# Patient Record
Sex: Female | Born: 1960 | Race: Black or African American | Hispanic: No | Marital: Single | Smoking: Never smoker
Health system: Southern US, Community
[De-identification: ages and names within clinical notes are randomized; demographics above are authoritative.]

## PROBLEM LIST (undated history)

## (undated) DIAGNOSIS — E079 Disorder of thyroid, unspecified: Secondary | ICD-10-CM

## (undated) DIAGNOSIS — E119 Type 2 diabetes mellitus without complications: Secondary | ICD-10-CM

## (undated) DIAGNOSIS — I10 Essential (primary) hypertension: Secondary | ICD-10-CM

## (undated) DIAGNOSIS — M1990 Unspecified osteoarthritis, unspecified site: Secondary | ICD-10-CM

## (undated) HISTORY — PX: HERNIA REPAIR: SHX51

## (undated) HISTORY — PX: ABDOMINAL HYSTERECTOMY: SHX81

---

## 1998-08-05 ENCOUNTER — Other Ambulatory Visit: Admission: RE | Admit: 1998-08-05 | Discharge: 1998-08-05 | Payer: Self-pay | Admitting: Obstetrics and Gynecology

## 1999-09-15 ENCOUNTER — Other Ambulatory Visit: Admission: RE | Admit: 1999-09-15 | Discharge: 1999-09-15 | Payer: Self-pay | Admitting: Obstetrics and Gynecology

## 2000-04-25 ENCOUNTER — Ambulatory Visit (HOSPITAL_BASED_OUTPATIENT_CLINIC_OR_DEPARTMENT_OTHER): Admission: RE | Admit: 2000-04-25 | Discharge: 2000-04-25 | Payer: Self-pay | Admitting: General Surgery

## 2001-11-01 ENCOUNTER — Other Ambulatory Visit: Admission: RE | Admit: 2001-11-01 | Discharge: 2001-11-01 | Payer: Self-pay | Admitting: Obstetrics and Gynecology

## 2002-12-05 ENCOUNTER — Encounter: Admission: RE | Admit: 2002-12-05 | Discharge: 2002-12-05 | Payer: Self-pay | Admitting: Family Medicine

## 2002-12-05 ENCOUNTER — Encounter: Payer: Self-pay | Admitting: Family Medicine

## 2003-03-05 ENCOUNTER — Other Ambulatory Visit: Admission: RE | Admit: 2003-03-05 | Discharge: 2003-03-05 | Payer: Self-pay | Admitting: Obstetrics and Gynecology

## 2010-08-20 ENCOUNTER — Inpatient Hospital Stay (HOSPITAL_COMMUNITY): Payer: Self-pay

## 2010-08-20 ENCOUNTER — Inpatient Hospital Stay (HOSPITAL_COMMUNITY)
Admission: AD | Admit: 2010-08-20 | Discharge: 2010-08-22 | DRG: 644 | Disposition: A | Discharge status: Home / Self Care | Payer: Self-pay | Source: Ambulatory Visit | Attending: Internal Medicine | Admitting: Internal Medicine

## 2010-08-20 DIAGNOSIS — G610 Guillain-Barre syndrome: Secondary | ICD-10-CM | POA: Diagnosis present

## 2010-08-20 DIAGNOSIS — E039 Hypothyroidism, unspecified: Principal | ICD-10-CM | POA: Diagnosis present

## 2010-08-20 DIAGNOSIS — I10 Essential (primary) hypertension: Secondary | ICD-10-CM | POA: Diagnosis present

## 2010-08-20 DIAGNOSIS — M79609 Pain in unspecified limb: Secondary | ICD-10-CM | POA: Diagnosis present

## 2010-08-20 DIAGNOSIS — E876 Hypokalemia: Secondary | ICD-10-CM | POA: Diagnosis present

## 2010-08-20 DIAGNOSIS — R29898 Other symptoms and signs involving the musculoskeletal system: Secondary | ICD-10-CM | POA: Diagnosis present

## 2010-08-20 LAB — CBC
HCT: 35.9 % — ABNORMAL LOW (ref 36.0–46.0)
Hemoglobin: 11.6 g/dL — ABNORMAL LOW (ref 12.0–15.0)
MCHC: 32.3 g/dL (ref 30.0–36.0)
MCV: 83.1 fL (ref 78.0–100.0)
RBC: 4.32 MIL/uL (ref 3.87–5.11)
RDW: 16.1 % — ABNORMAL HIGH (ref 11.5–15.5)
WBC: 7.3 10*3/uL (ref 4.0–10.5)

## 2010-08-20 LAB — COMPREHENSIVE METABOLIC PANEL
ALT: 14 U/L (ref 0–35)
AST: 17 U/L (ref 0–37)
Albumin: 3.9 g/dL (ref 3.5–5.2)
Alkaline Phosphatase: 49 U/L (ref 39–117)
CO2: 24 mEq/L (ref 19–32)
Chloride: 108 mEq/L (ref 96–112)
Creatinine, Ser: 0.83 mg/dL (ref 0.4–1.2)
Glucose, Bld: 100 mg/dL — ABNORMAL HIGH (ref 70–99)
Potassium: 3.1 mEq/L — ABNORMAL LOW (ref 3.5–5.1)
Total Protein: 7.3 g/dL (ref 6.0–8.3)

## 2010-08-20 LAB — URINALYSIS, ROUTINE W REFLEX MICROSCOPIC
Bilirubin Urine: NEGATIVE
Hgb urine dipstick: NEGATIVE
Ketones, ur: NEGATIVE mg/dL
Nitrite: NEGATIVE
Protein, ur: NEGATIVE mg/dL
Specific Gravity, Urine: 1.022 (ref 1.005–1.030)
Urine Glucose, Fasting: NEGATIVE mg/dL
Urobilinogen, UA: 0.2 mg/dL (ref 0.0–1.0)
pH: 6 (ref 5.0–8.0)

## 2010-08-20 LAB — PROTIME-INR
INR: 1.05 (ref 0.00–1.49)
Prothrombin Time: 13.9 seconds (ref 11.6–15.2)

## 2010-08-20 LAB — DIFFERENTIAL
Basophils Absolute: 0 10*3/uL (ref 0.0–0.1)
Eosinophils Absolute: 0.3 10*3/uL (ref 0.0–0.7)
Lymphs Abs: 3.3 10*3/uL (ref 0.7–4.0)
Monocytes Absolute: 0.5 10*3/uL (ref 0.1–1.0)
Neutro Abs: 3.2 10*3/uL (ref 1.7–7.7)

## 2010-08-20 IMAGING — CT CT HEAD W/O CM
1 series · 16 of 30 positions shown, 20 images · non-contrast
Comparison: None.

CLINICAL DATA: Headache, dizziness, blurred vision

CT HEAD WITHOUT CONTRAST
TECHNIQUE: Contiguous axial images were obtained from the base of
the skull through the vertex without contrast.

[Series 2: head_seq 4.5 h37s st · axial · 0.43mm/px · z∈[+524,+650]mm · 16 of 32 slices shown, 20 images]
[im 2/32  brain]
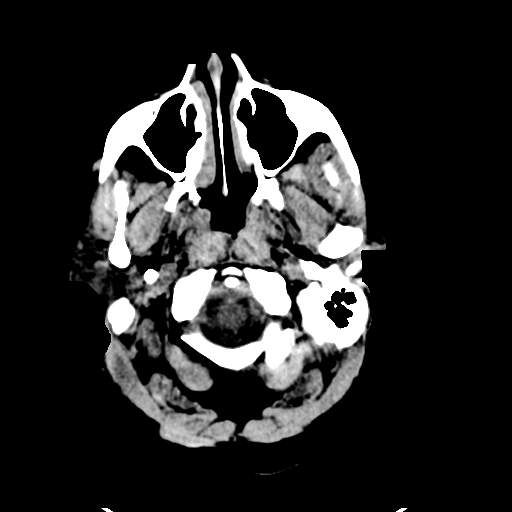
[im 2/32  bone]
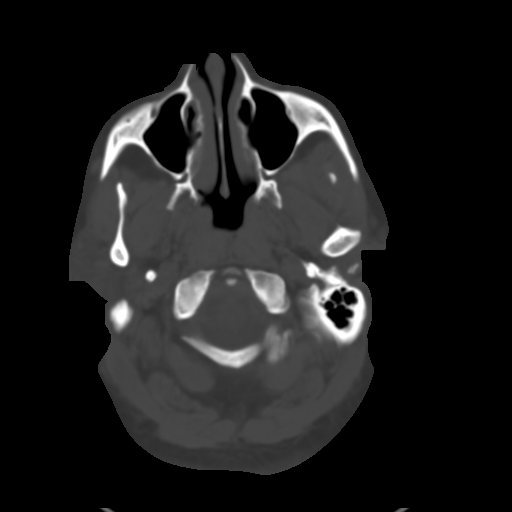
[im 4/32  brain]
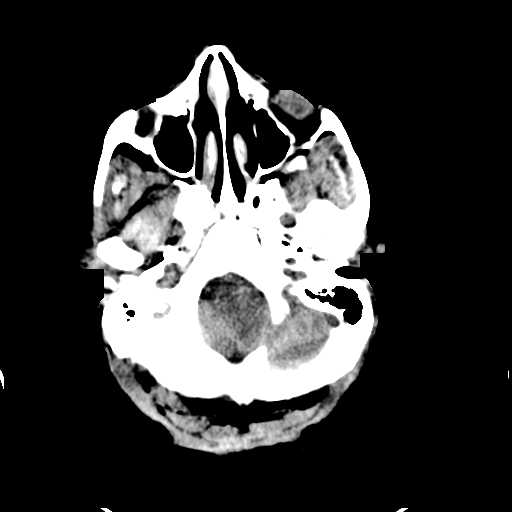
[im 6/32  brain]
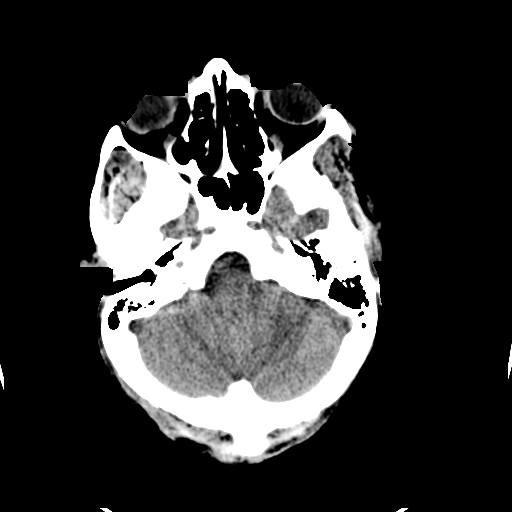
[im 8/32  brain]
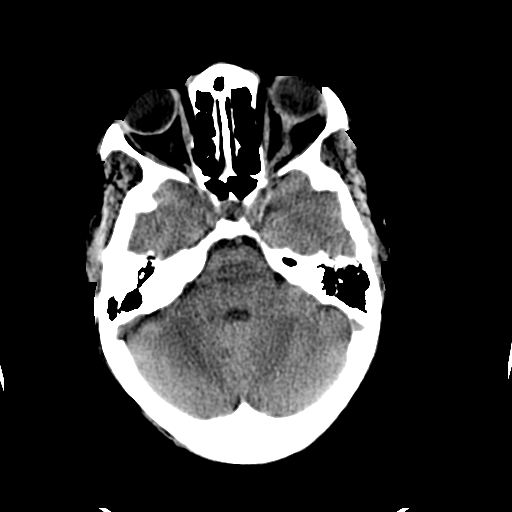
[im 9/32  brain]
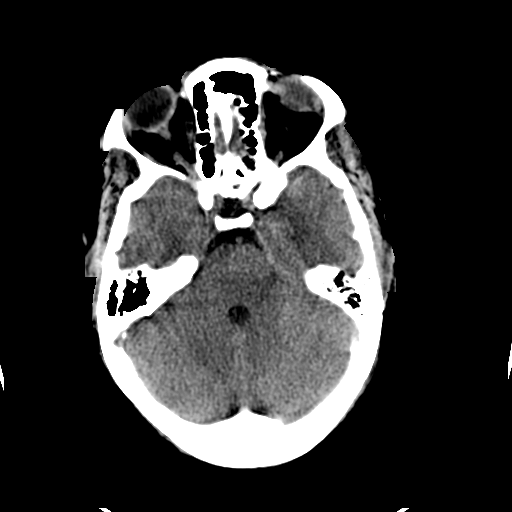
[im 9/32  bone]
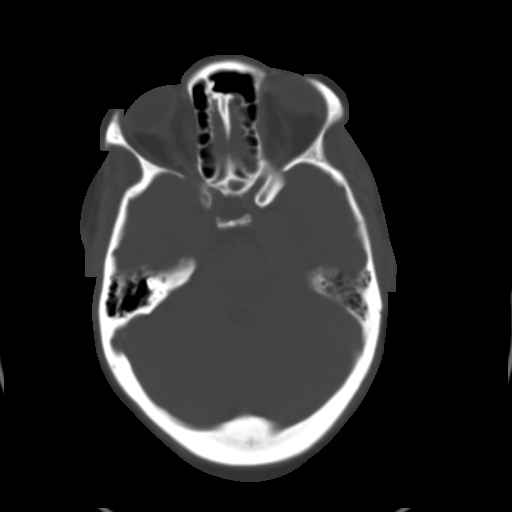
[im 11/32  brain]
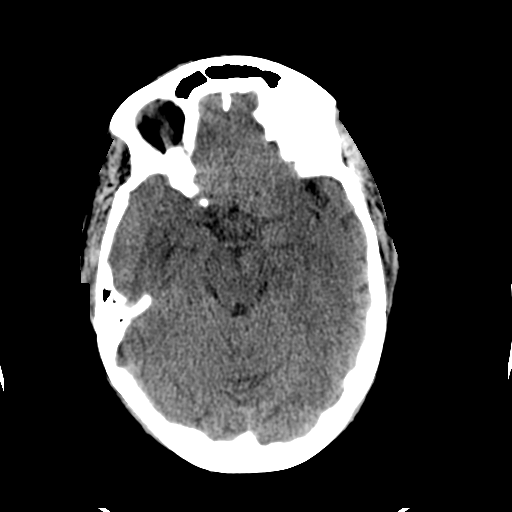
[im 13/32  brain]
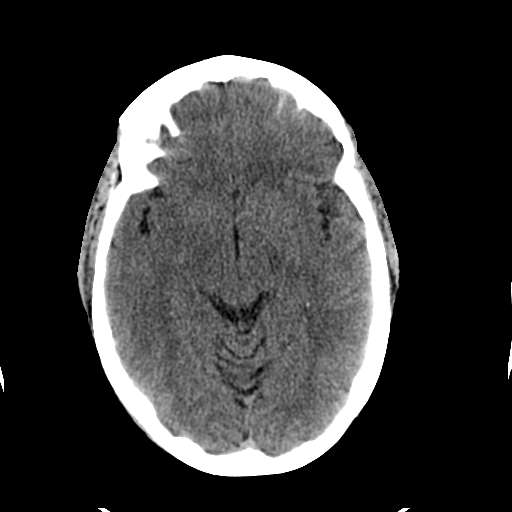
[im 15/32  brain]
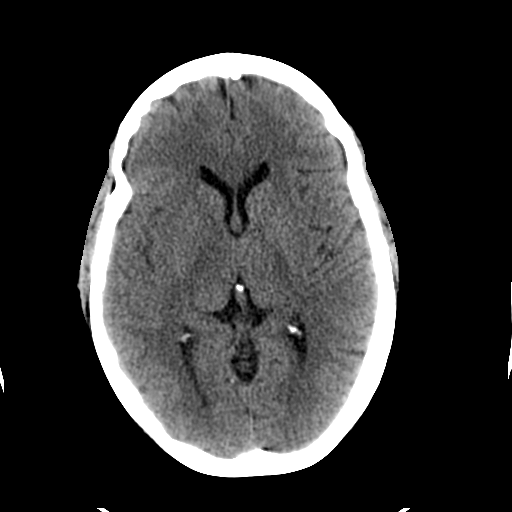
[im 17/32  brain]
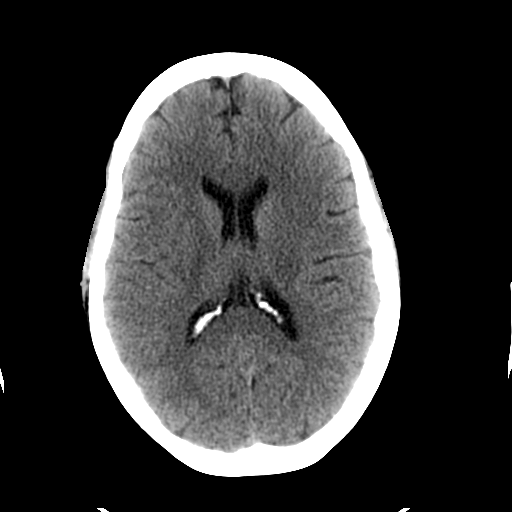
[im 17/32  bone]
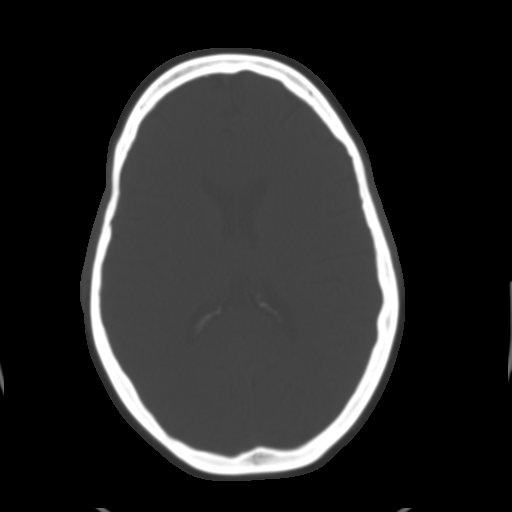
[im 19/32  brain]
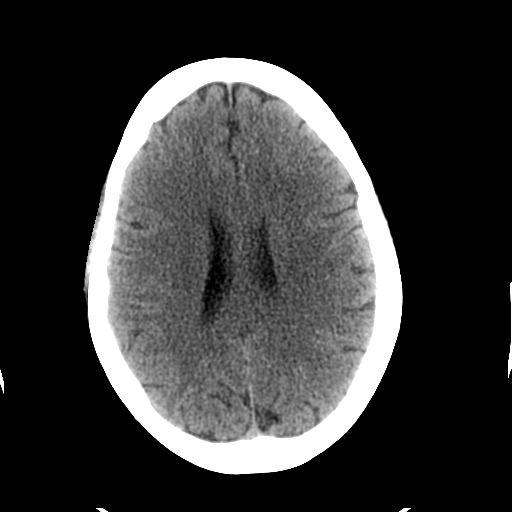
[im 21/32  brain]
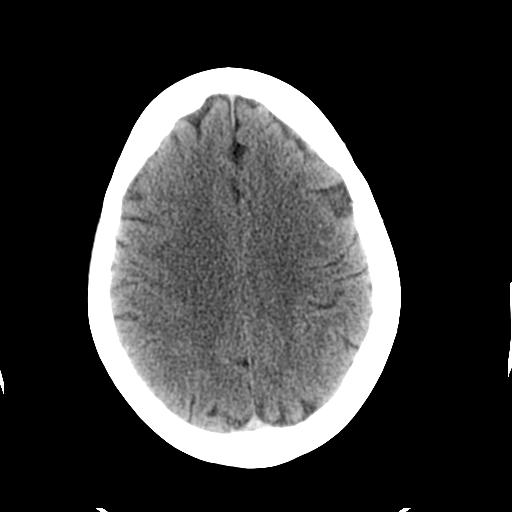
[im 23/32  brain]
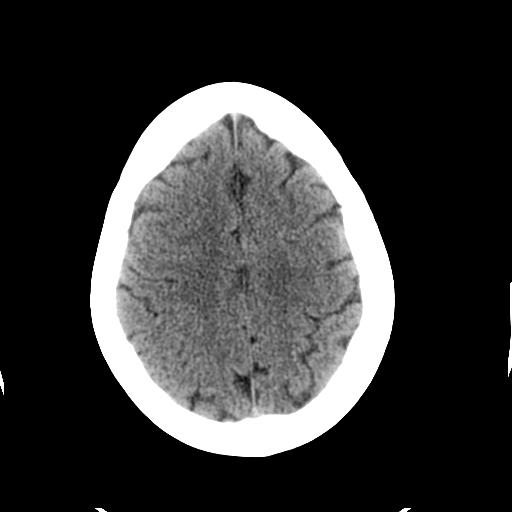
[im 24/32  brain]
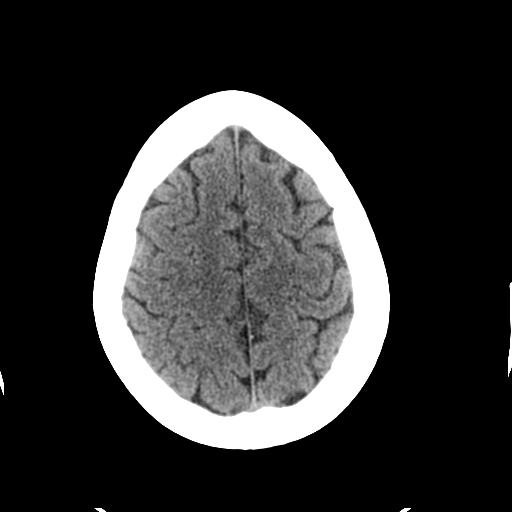
[im 24/32  bone]
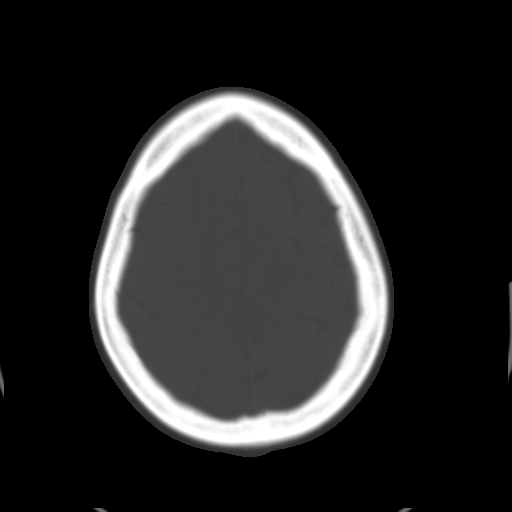
[im 26/32  brain]
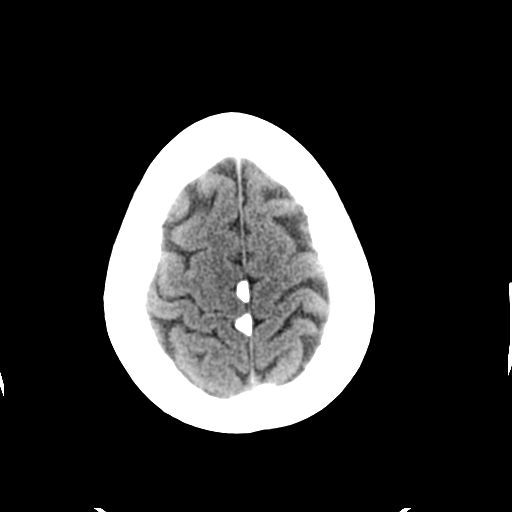
[im 28/32  brain]
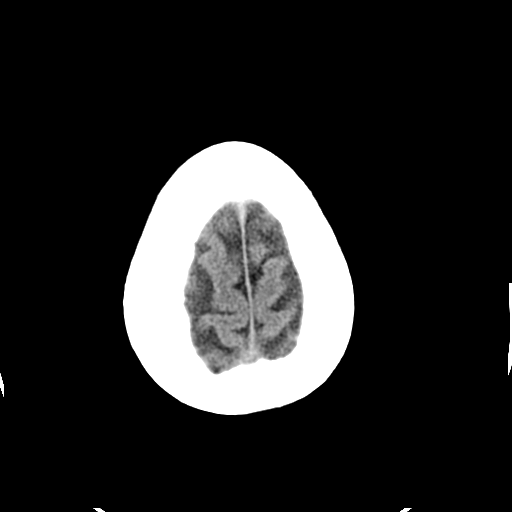
[im 30/32  brain]
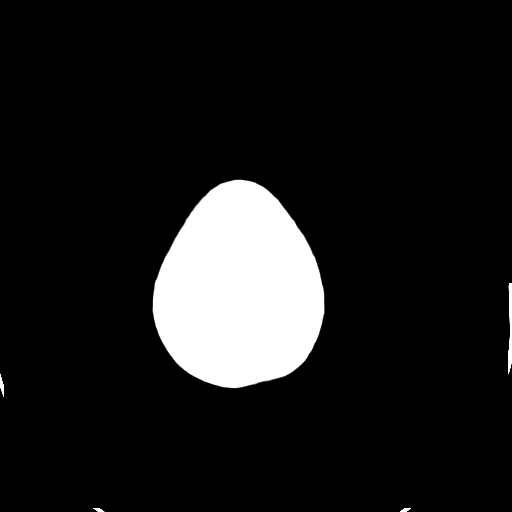

[16 of 30 positions shown; findings below may reference images not displayed]

FINDINGS: There is no evidence of acute intracranial hemorrhage, brain edema,
mass lesion, acute infarction,   mass effect, or midline shift.
Acute infarct may be inapparent on noncontrast CT.  No other intra-
axial abnormalities are seen, and the ventricles and sulci are
within normal limits in size and symmetry.   No abnormal extra-
axial fluid collections or masses are identified.  No significant
calvarial abnormality.
IMPRESSION: 1. Negative for bleed or other acute intracranial process.

## 2010-08-20 IMAGING — CR DG CHEST 2V
2 series · 2 of 2 positions shown · non-contrast
Comparison: None.

CLINICAL DATA: Chest pain

CHEST - 2 VIEW

[w chest pa]
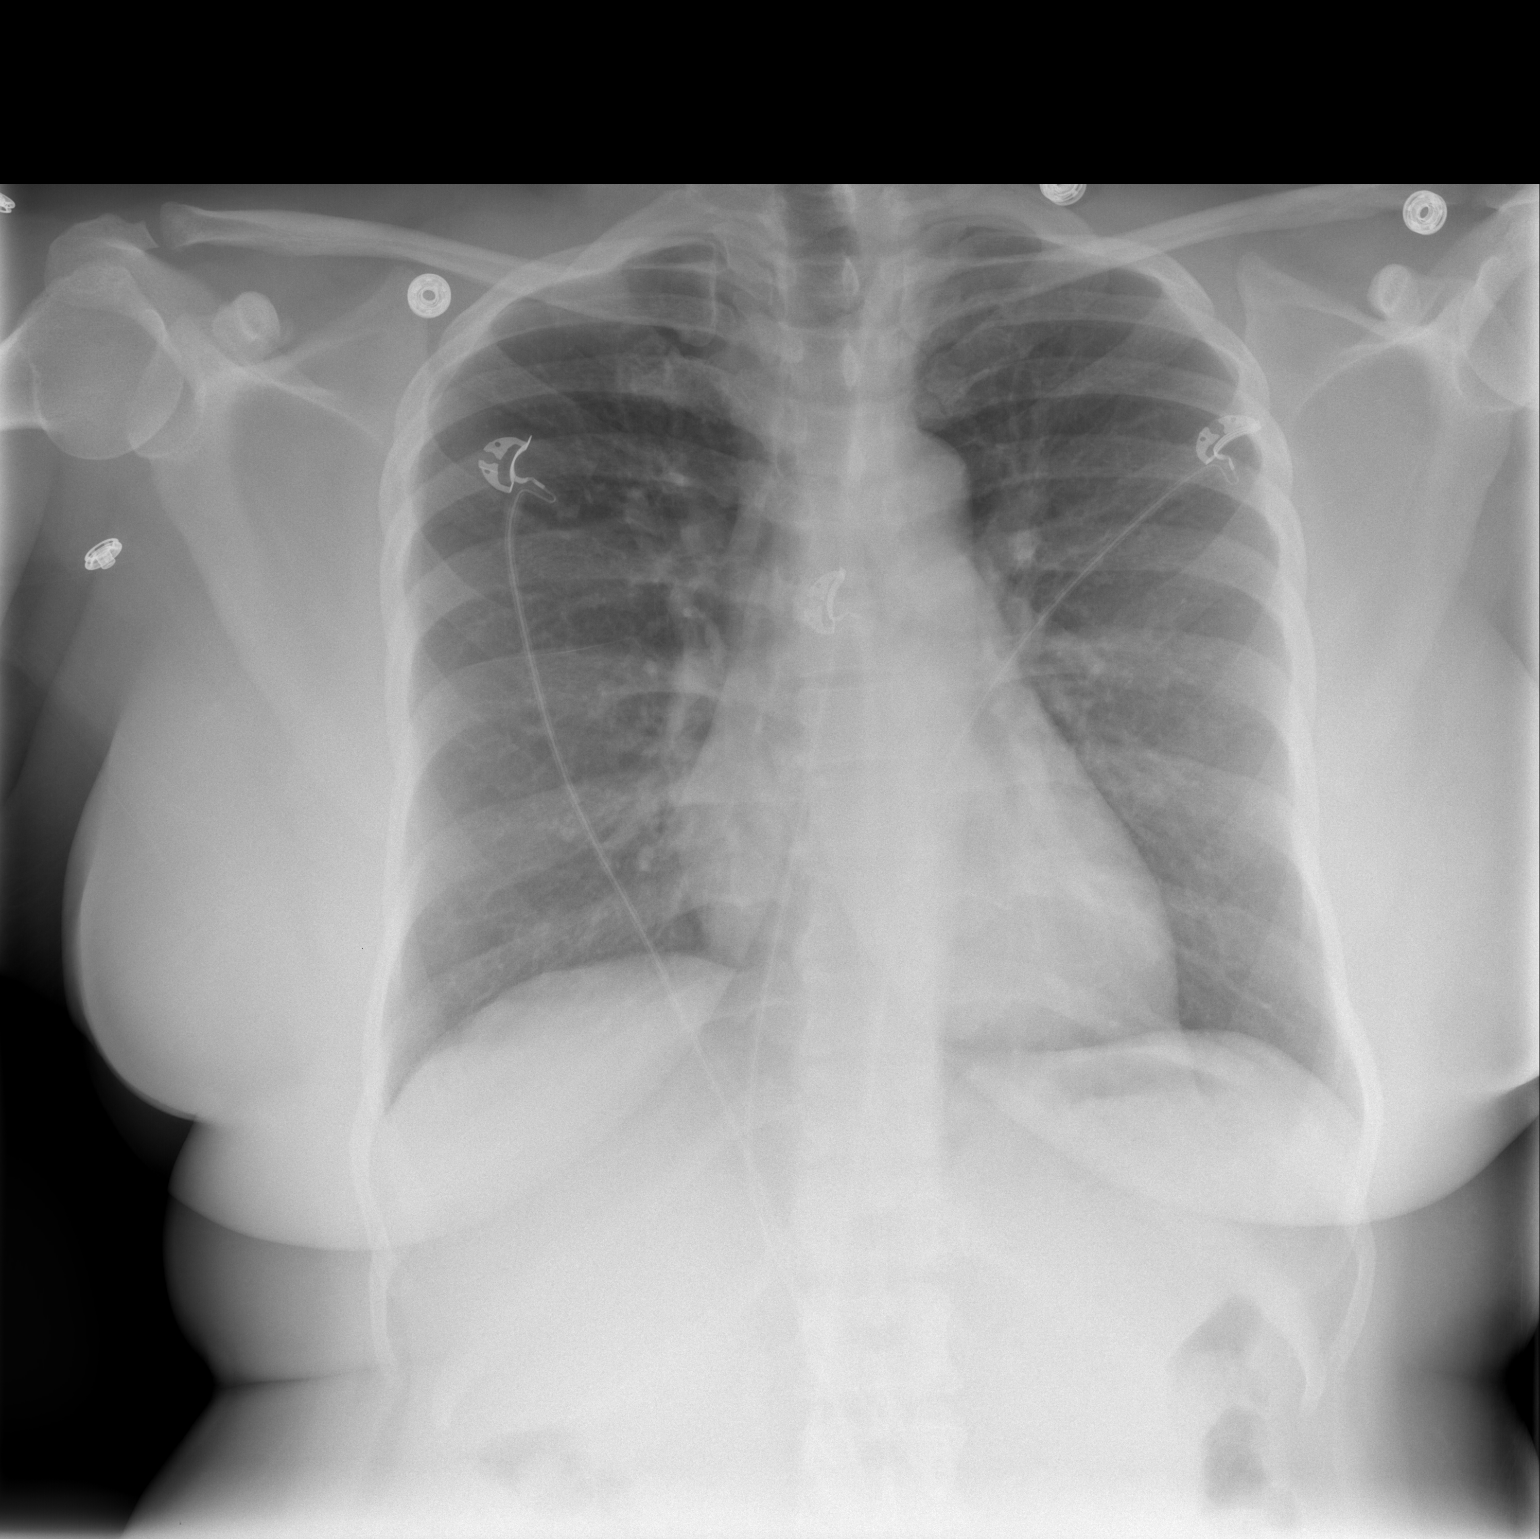

[w chest lat]
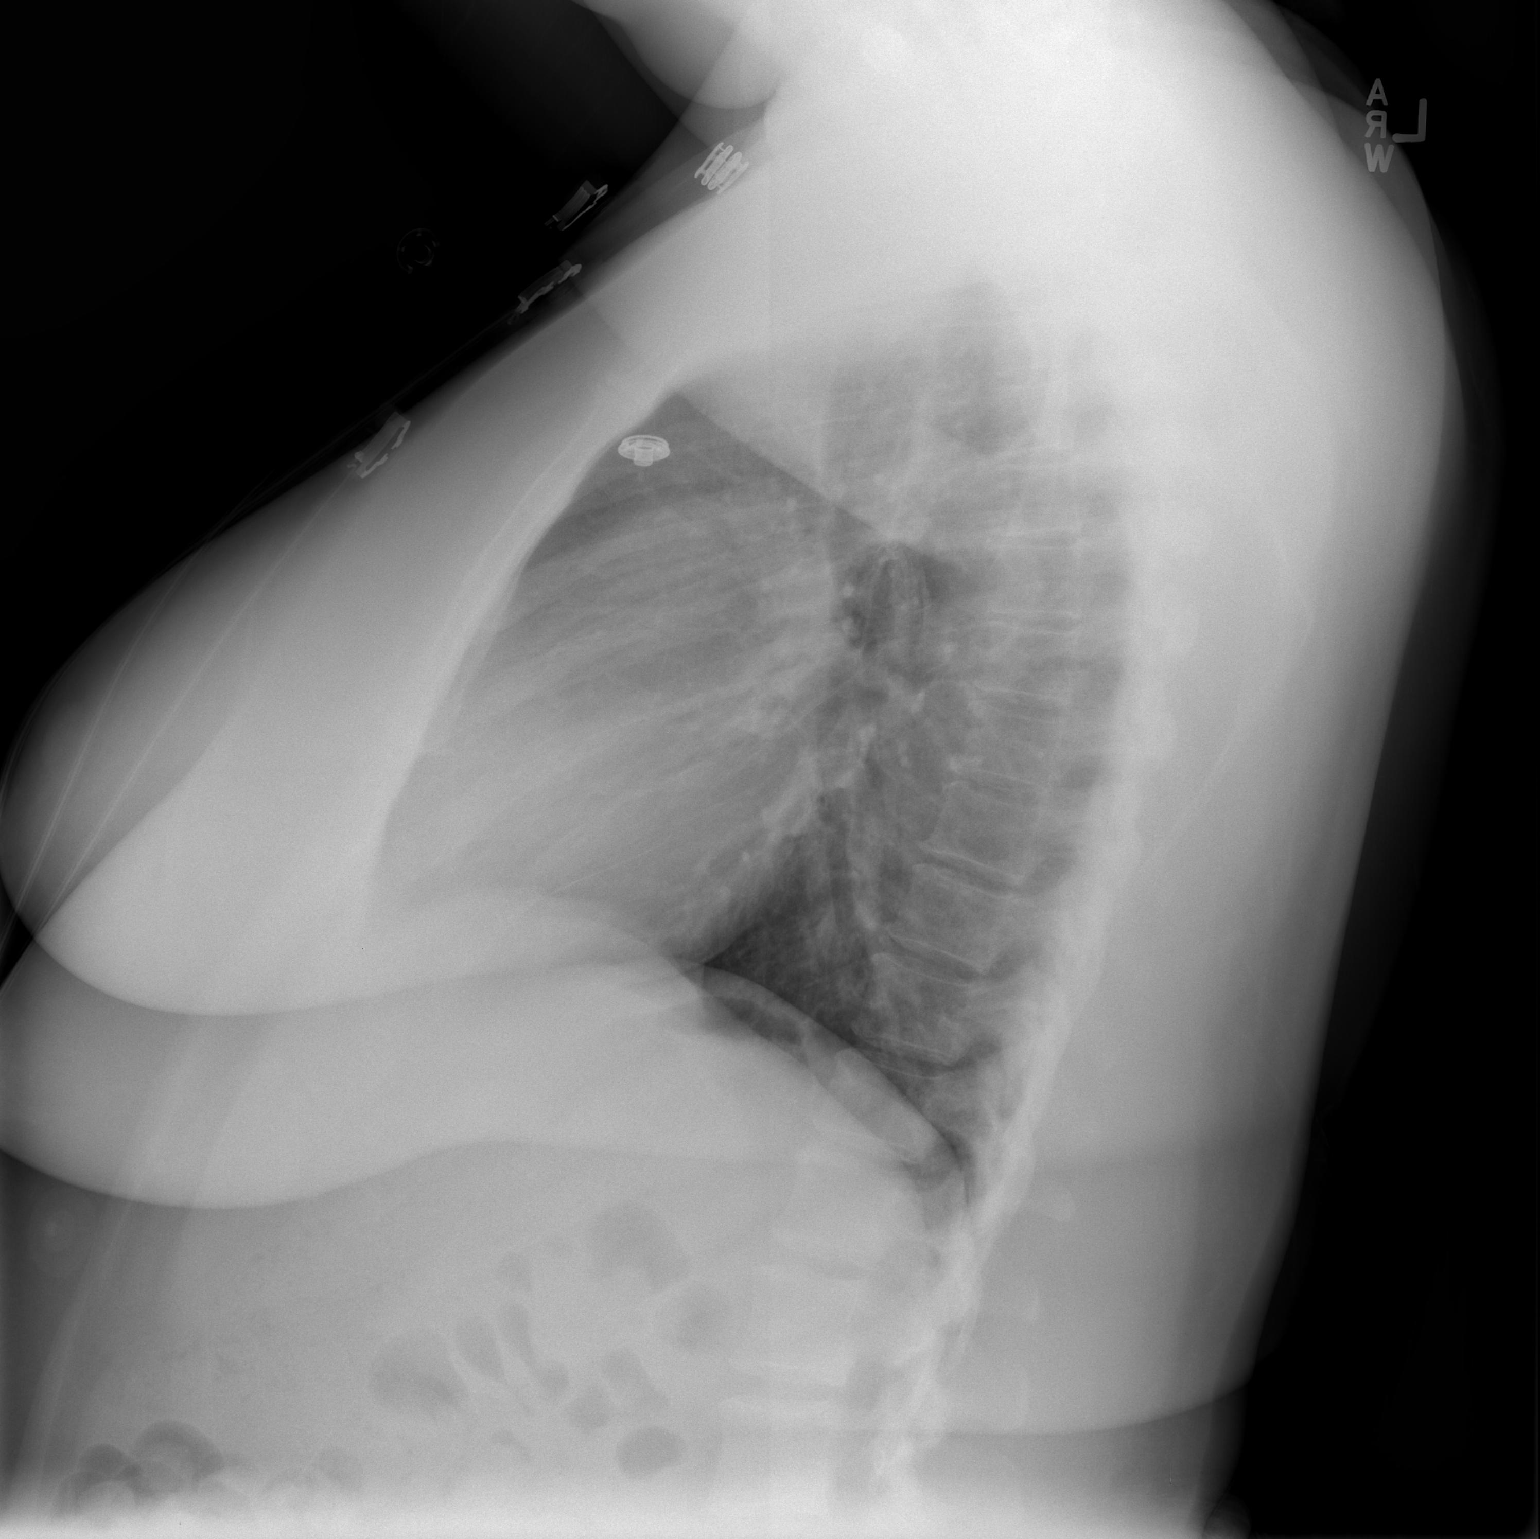

[2 of 2 positions shown; findings below may reference images not displayed]

FINDINGS: Lungs clear.  Heart size and pulmonary vascularity
normal.  No effusion.  Visualized bones unremarkable.
IMPRESSION: No acute disease

## 2010-08-21 ENCOUNTER — Inpatient Hospital Stay (HOSPITAL_COMMUNITY): Payer: Self-pay

## 2010-08-21 DIAGNOSIS — R072 Precordial pain: Secondary | ICD-10-CM

## 2010-08-21 LAB — COMPREHENSIVE METABOLIC PANEL
CO2: 25 mEq/L (ref 19–32)
Calcium: 9 mg/dL (ref 8.4–10.5)
Creatinine, Ser: 0.84 mg/dL (ref 0.4–1.2)
GFR calc non Af Amer: >60 mL/min (ref >60)
Glucose, Bld: 114 mg/dL — ABNORMAL HIGH (ref 70–99)
Total Protein: 6.4 g/dL (ref 6.0–8.3)

## 2010-08-21 LAB — DIFFERENTIAL
Basophils Relative: 0 % (ref 0–1)
Eosinophils Relative: 5 % (ref 0–5)
Lymphocytes Relative: 48 % — ABNORMAL HIGH (ref 12–46)
Monocytes Absolute: 0.5 10*3/uL (ref 0.1–1.0)
Monocytes Relative: 8 % (ref 3–12)
Neutro Abs: 2.4 10*3/uL (ref 1.7–7.7)

## 2010-08-21 LAB — MAGNESIUM: Magnesium: 2.1 mg/dL (ref 1.5–2.5)

## 2010-08-21 LAB — CBC
HCT: 35.3 % — ABNORMAL LOW (ref 36.0–46.0)
Hemoglobin: 11.2 g/dL — ABNORMAL LOW (ref 12.0–15.0)
MCH: 26.8 pg (ref 26.0–34.0)
MCHC: 31.7 g/dL (ref 30.0–36.0)
RDW: 16.4 % — ABNORMAL HIGH (ref 11.5–15.5)

## 2010-08-21 LAB — RPR: RPR Ser Ql: NONREACTIVE

## 2010-08-21 LAB — APTT: aPTT: 32 seconds (ref 24–37)

## 2010-08-21 LAB — LIPID PANEL
Cholesterol: 200 mg/dL (ref 0–200)
LDL Cholesterol: 133 mg/dL — ABNORMAL HIGH (ref 0–99)

## 2010-08-21 LAB — TSH
TSH: 14.007 u[IU]/mL — ABNORMAL HIGH (ref 0.350–4.500)
TSH: 12.471 u[IU]/mL — ABNORMAL HIGH (ref 0.350–4.500)

## 2010-08-21 LAB — SEDIMENTATION RATE: Sed Rate: 9 mm/hr (ref 0–22)

## 2010-08-21 LAB — C-REACTIVE PROTEIN: CRP: 0.4 mg/dL — ABNORMAL LOW (ref <0.6)

## 2010-08-21 LAB — PHOSPHORUS: Phosphorus: 4.2 mg/dL (ref 2.3–4.6)

## 2010-08-21 IMAGING — CR DG LUMBAR SPINE COMPLETE 4+V
5 series · 5 of 5 positions shown · non-contrast
Comparison: None.

CLINICAL DATA: 49-year-old female with pain.  No known injury.

LUMBAR SPINE - COMPLETE 4+ VIEW

[t l-spine a.p.]
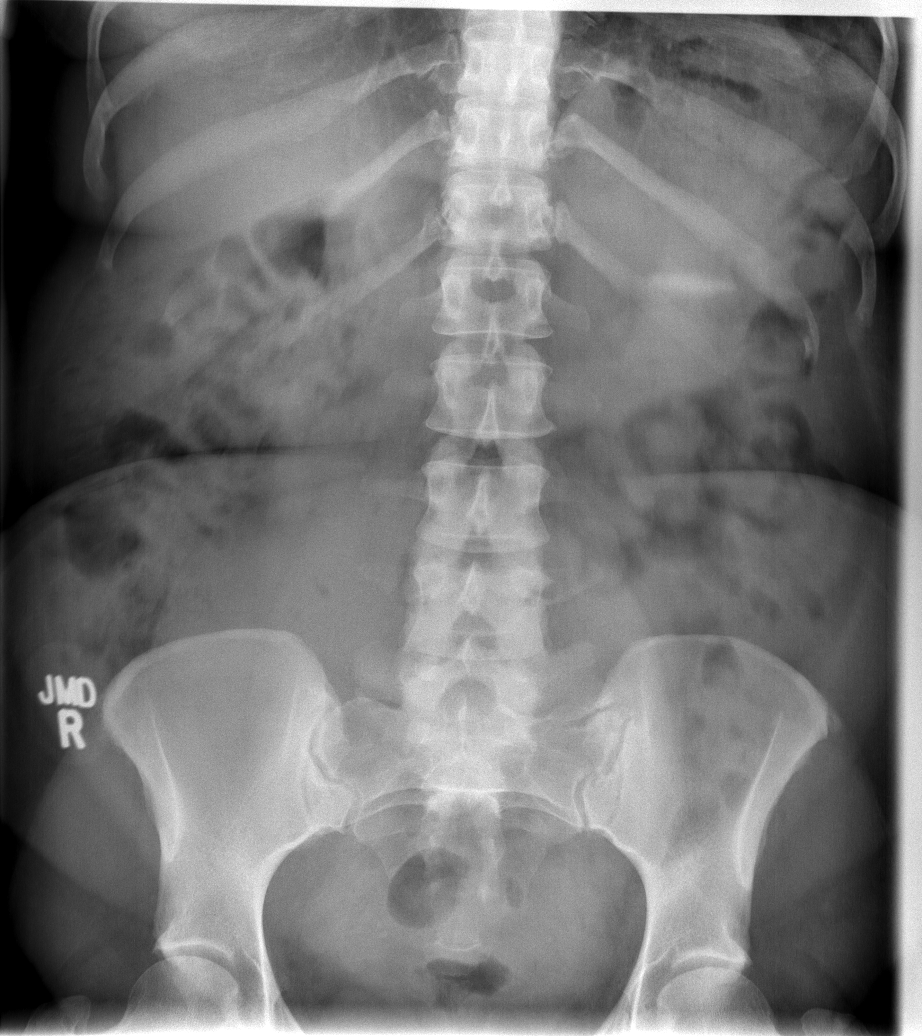

[t l-spine oblique exposure *]
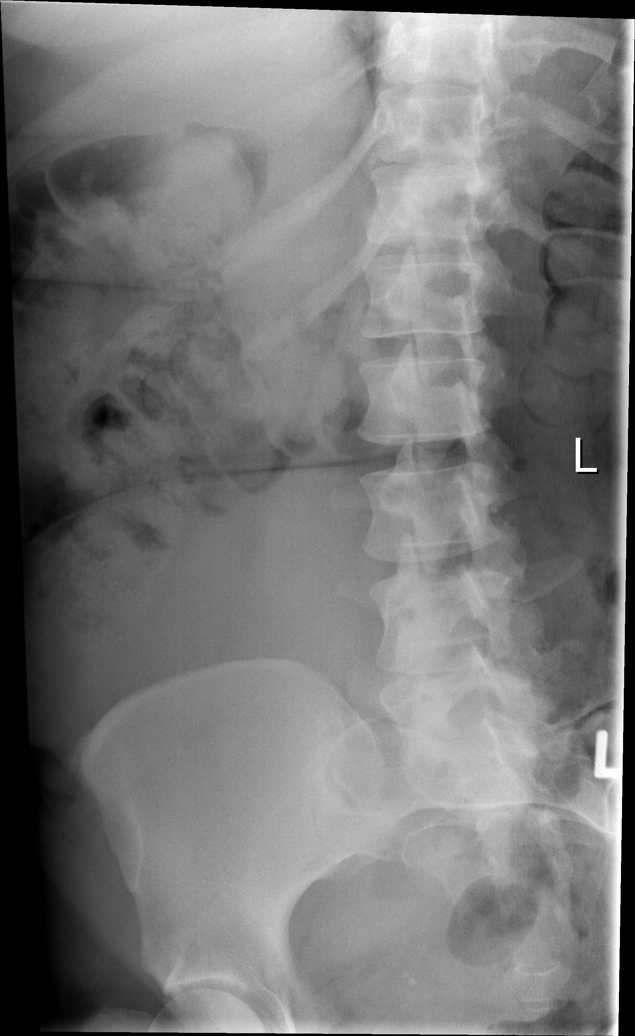

[t l-spine oblique exposure]
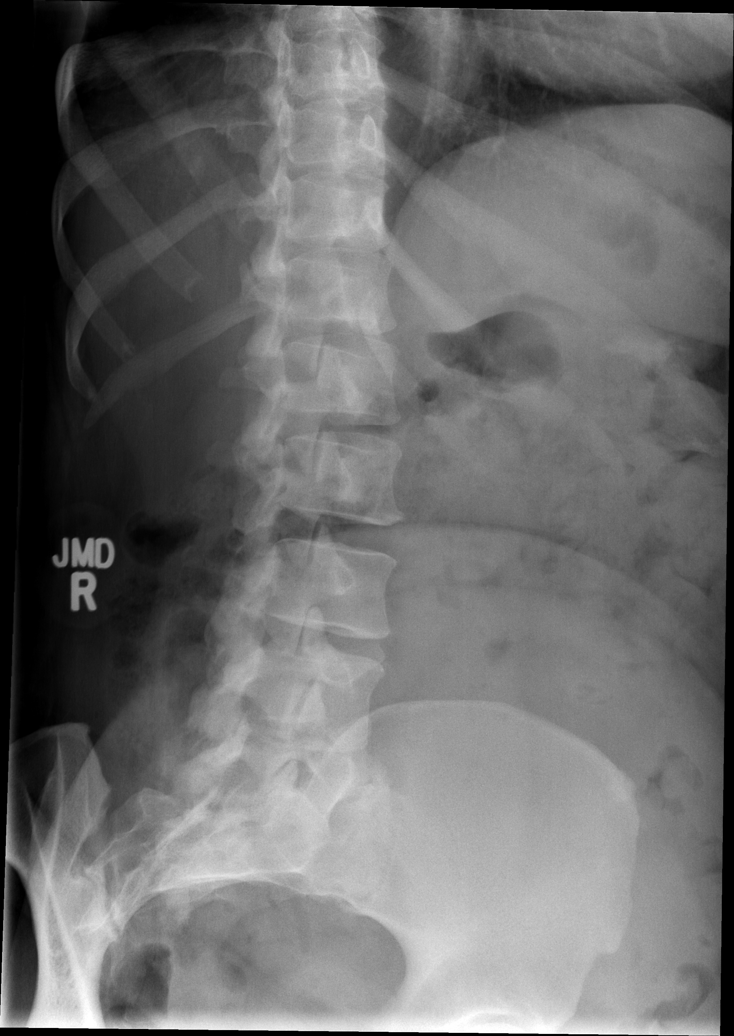

[t l-spine lat]
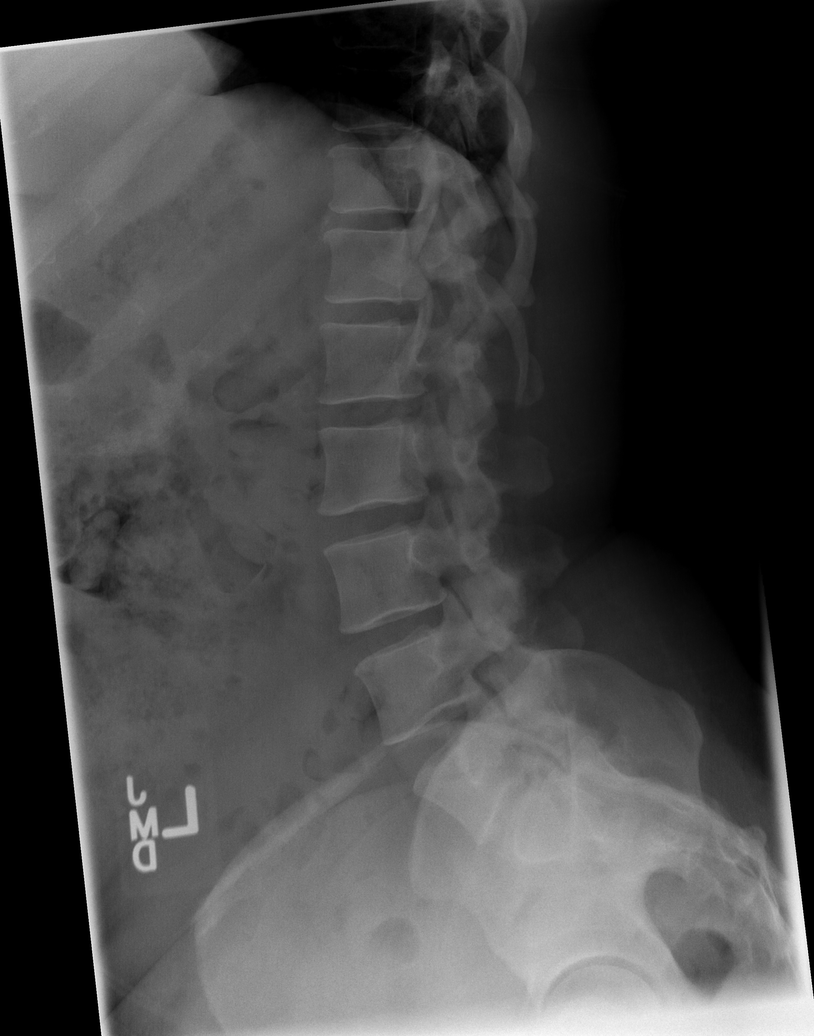

[t l-spine l5-s1 spot]
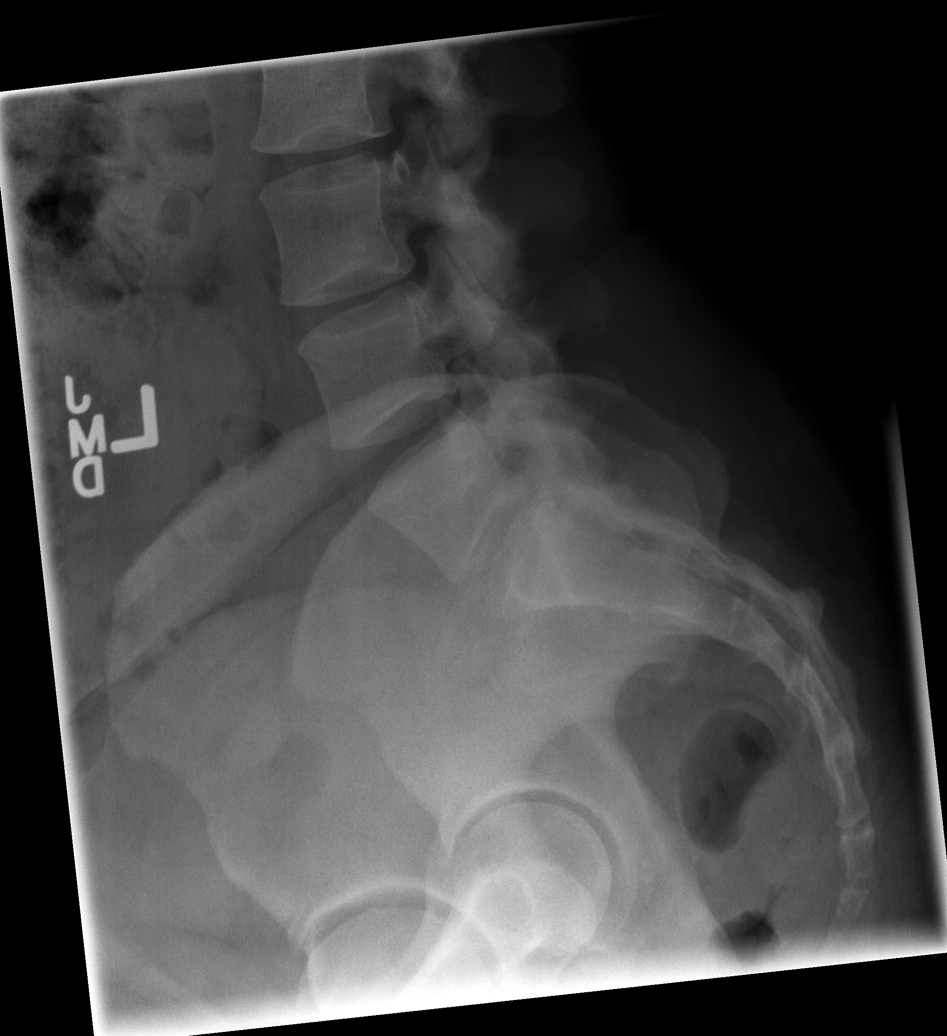

[5 of 5 positions shown; findings below may reference images not displayed]

FINDINGS: Partially sacralized L5 level.  Relatively preserved disc
spaces.  Normal vertebral height and alignment.  Mild lower lumbar
facet hypertrophy.  No pars fracture.  Mildly sclerotic
assimilation joint at L5-L1 on the left.  SI joints within normal
limits.
IMPRESSION: No acute osseous abnormality in the lumbar spine.  Partially
sacralized L5 level on the left with mildly sclerotic assimilation
joint.

## 2010-08-21 NOTE — H&P (Signed)
[name], [name]              ACCOUNT NO.:  [id number]  MEDICAL RECORD NO.:  [id number]           PATIENT TYPE:  I  LOCATION:  1409                         FACILITY:  [facility]  PHYSICIAN:  Kathlen Mody, MD       DATE OF BIRTH:  1960-12-03  DATE OF ADMISSION:  08/20/2010 DATE OF DISCHARGE:                             HISTORY & PHYSICAL   PRIMARY CARE PHYSICIAN:  Jackie Plum, M.D.  CHIEF COMPLAINT:  Bilateral lower extremity weakness since 2 weeks.  HISTORY OF PRESENT ILLNESS:  This is a 50 year old lady with a history of hypertension, hypothyroidism, Gullian-Barre syndrome in 2006, was sent from Dr. Rubye Oaks office for bilateral lower extremity weakness since December 2011.  Also complaining of tingling sensation in her hands and arms, upper extremities since is December and bilateral lower extremity tingling and numbness since two weeks.  She also is complaining of burning pain over her lower extremities since 2 weeks and lower back pain since 2 weeks.  Also complaining of dizziness, intermittent with some gait instability since 2 weeks.  Denies any fever, chest pain or complaining of occasional shortness of breath on walking.  Lower back pain, intermittent associated with exertion, nonradiating, not sciatica pain.  No headaches, no blurry vision.  No photophobia with burning of the eyes.  No fevers, chills.  No nausea, vomiting, or diarrhea.  Also complaining of burning pain over the lower extremities and abdomen since 2 weeks.  Denies any urinary symptoms and has chronic constipation.  No history of syncope.  The patient had a GBS in 2006 where she was treated with IVIG in [place].  REVIEW OF SYSTEMS:  See HPI, otherwise negative.  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  PAST SURGICAL HISTORY:  Hysterectomy.  HOME MEDICATIONS:  Synthroid 125 mcg daily, atenolol 25 mg 1 tablet daily, occasional diclofenac for  possible gout, ibuprofen occasionally, and Tums occasionally for heartburn.  SOCIAL HISTORY:  The patient lives by herself.  She is single.  She denies smoking, EtOH or IV drug abuse.  FAMILY HISTORY:  Significant for coronary artery disease and diabetes.  ALLERGIES:  No known drug allergies.  PHYSICAL EXAMINATION:  Temperature of 98.6, blood pressure 119/78, pulse of 72, respiratory rate of 18, saturating 98% on room air. On exam, she is alert, afebrile and oriented x3, in no acute distress. HEENT EXAM:  Pupils equal and reacting to light.  Moist mucous membranes.  No JVD. CARDIOVASCULAR EXAM:  S1, S2 heard.  No rubs, murmurs or gallops. Regular rate and rhythm. RESPIRATORY:  Good air entry bilateral.  No adventitious sounds. ABDOMEN:  Obese, soft, nontender, and nondistended.  Good bowel sounds. EXTREMITIES:  No pedal edema. NEUROLOGICAL:  Cranial nerves II-XII within normal limits.  Gait instability.  Motor strength 5/5 in lower extremities.  Upper extremities 5/5.  Babinski flexors.  LABORATORY DATA:  Labs are pending.  RADIOLOGY:  Pending.  ASSESSMENT/PLAN: 1. Bilateral lower extremity weakness with paresthesias, sensory     abnormalities and burning sensation with a history of Gullian-Barre     syndrome.  We will get a CT  of the head without contrast and admit     the patient to telemetry.  We will get a Neurology consult to see     if she has recurrent Gullian-Barre syndrome versus     peripheral neuropathy.  Will get a vitamin B12 level. will also get a x ray ls spine 2. Hypothyroidism.  Get a TSH level and continue her home dose of     Synthroid 125 mcg daily. 3. Hypertension.  Blood pressure is controlled at this time.  We will     start her on atenolol 25 mg 1 tablet daily. 4. Deep venous thrombosis prophylaxis.  Subcutaneous Lovenox. 5. The patient is full code.          ______________________________ Kathlen Mody, MD     VA/MEDQ  D:  08/20/2010  T:   08/20/2010  Job:  [id number]  Electronically Signed by Kathlen Mody MD on 08/21/2010 12:27:52 AM

## 2010-08-22 LAB — BASIC METABOLIC PANEL
BUN: 11 mg/dL (ref 6–23)
Calcium: 9 mg/dL (ref 8.4–10.5)
Chloride: 107 mEq/L (ref 96–112)
Creatinine, Ser: 0.81 mg/dL (ref 0.4–1.2)

## 2010-08-22 LAB — CK TOTAL AND CKMB (NOT AT ARMC): Total CK: 250 U/L — ABNORMAL HIGH (ref 7–177)

## 2010-09-24 NOTE — Consult Note (Signed)
[name], [name]              ACCOUNT NO.:  [id number]  MEDICAL RECORD NO.:  [id number]           PATIENT TYPE:  I  LOCATION:  1409                         [facility]ACILITY:  [facility]  PHYSICIAN:  [name], M.D.  DATE OF BIRTH:  March 19, 1961  DATE OF CONSULTATION:  08/21/2010 DATE OF DISCHARGE:                                CONSULTATION   REASON FOR CONSULTATION:  Bilateral lower extremity weakness for 2 months.  HISTORY OF PRESENT ILLNESS:  This is a 50 year old African American female who was sent to [facility] from Dr. Rubye [name] office for a 2- month history of bilateral lower extremity weakness, bilateral upper extremity weakness, and generalized malaise.  The patient states that [place] noted her right leg was weak to the point where she needed to use a wheelchair.  As time went on, approximately 2 weeks later, the patient noted bilateral lower extremity generalized weakness, numbness and tingling in her feet, numbness and tingling in bilateral hands, and then gradually overall general malaise.  Over the past 2 weeks, she has noted that she has had back pain, bilateral leg pain, bilateral elbow pain, pain to palpation of all of her musculoskeletal groups.  The patient went to see Dr. Cloyd Stagers- [name] about these issues and was sent to [facility] for question of GBS as she has had a history of questionable GBS in 2006. The patient states back in 2006 she was at Dr. Mikey [name] office in [place] where she was diagnosed with GBS and given IVIG.  She has had no problems since that day.  In addition, she states that during that time period she never lost her deep tendon reflexes nor did she have gait troubles at that time.  PAST MEDICAL HISTORY: 1. Hypertension. 2. Hypothyroidism. 3. Questionable GBS in 2006 status post IVIG. 4. Hysterectomy.  HOME MEDICATIONS: 1. Synthroid 125 mcg daily. 2. Atenolol 25 mg 1 tablet  daily. 3. Occasional diclofenac for musculoskeletal pain. 4. Ibuprofen occasionally.  ALLERGIES:  No known drug allergies.  SOCIAL HISTORY:  The patient does not smoke, drink, or do illicit drugs. She is single and lives alone.  REVIEW OF SYSTEMS:  Positive for generalized malaise, pain in all extremities to palpation, joint pain in elbows and bilateral knees, difficulty with gait, numbness and tingling in upper and lower extremities.  PHYSICAL EXAMINATION:  VITAL SIGNS:  Blood pressure is 106/71, pulse 68, respiration 20, temperature 99.0. MENTAL STATUS:  The patient is alert.  She is oriented.  She carries out 2 and 3 step commands without any difficulty. PULMONARY:  Clear to auscultation bilaterally. CARDIOVASCULAR:  S1 and S2 are audible. NECK:  Negative for bruits and supple. NEUROLOGICAL EXAM:  Pupils are equal, round, and reactive to light and accommodation.  Conjugate gaze.  Extraocular movements are intact. Visual fields are grossly intact.  Face is symmetrical.  Tongue is midline.  Uvula is midline.  Face sensation is full to pinprick and light touch.  Shoulder shrug and head turn are within normal limits. Coordination:  Finger-to-nose and heel-to-shin are smooth.  Gait:  The patient  is able to get up and walk without assistance.  She has a shuffled gait and at times wobbles, but her gait is actually very narrow and walks in a tandem gait.  Motor:  The patient has 5/5 strength throughout.  Deep tendon reflexes 2+ throughout in the biceps, triceps, brachioradialis, patella, Achilles and she has bilateral downgoing toes. Drift:  Negative drift in the upper and lower extremities.  Sensation: The patient has full sensation to pinprick, light touch, and vibration throughout.  She states that subjectively the right arm and leg might be slightly decreased to light touch.  LABORATORY DATA:  TSH is 12.471.  Sodium 138, potassium 3.2, chloride 106, CO2 of 25, BUN 11, creatinine  0.84, glucose 114.  White blood cell count 6.1, platelets are 292, hemoglobin is 11.2, hematocrit 35.3.  B12 is 774.  Cholesterol 200, triglycerides 101, HDL 47, LDL is 133.  IMAGING:  CT head is negative.  ASSESSMENT:  This is a 50 year old female with multiple complaints including bilateral elbow pain, knee pain, leg pain, weakness for 2 months.  The patient has brisk reflexes and full strength.  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  RECOMMENDATIONS: 1. Nerve conduction velocity/EMG outpatient. 2. Labs including ANA with reflex, RPR, CRP, sed rate. 3. Recommend obtaining Dr. Mikey [name] records in [place]. 4. Recommend placing the patient on a prednisone taper starting with     30 mg daily for 3 days, 20 mg daily for 3 days, 10 mg daily for 3     days, 5 mg daily for 3 days, 1 mg daily for 3 days, and then off.     Would also recommend the patient follow up with Dr. Julio [name] and     then follow up with a rheumatologist outpatient.     Natasha Morn, PA-C   ______________________________ [name], M.D.    DS/MEDQ  D:  08/21/2010  T:  08/22/2010  Job:  [id number]  cc:   [name], M.D. Fax: [phone]  Electronically Signed by Natasha Morn PA-C on 08/24/2010 03:08:37 PM Electronically Signed by [name] M.D. on 09/24/2010 12:57:14 PM

## 2019-01-31 ENCOUNTER — Other Ambulatory Visit: Payer: Self-pay

## 2019-01-31 DIAGNOSIS — Z20822 Contact with and (suspected) exposure to covid-19: Secondary | ICD-10-CM

## 2019-02-01 LAB — NOVEL CORONAVIRUS, NAA: SARS-CoV-2, NAA: NOT DETECTED

## 2019-02-16 ENCOUNTER — Telehealth: Payer: Self-pay | Admitting: General Practice

## 2019-02-16 NOTE — Telephone Encounter (Signed)
Negative COVID results given. Patient results "NOT Detected." Caller expressed understanding. ° °

## 2021-10-20 ENCOUNTER — Emergency Department (HOSPITAL_COMMUNITY): Payer: Self-pay

## 2021-10-20 ENCOUNTER — Other Ambulatory Visit: Payer: Self-pay

## 2021-10-20 ENCOUNTER — Emergency Department (HOSPITAL_COMMUNITY)
Admission: EM | Admit: 2021-10-20 | Discharge: 2021-10-20 | Disposition: A | Discharge status: Home / Self Care | Payer: Self-pay | Attending: Emergency Medicine | Admitting: Emergency Medicine

## 2021-10-20 ENCOUNTER — Encounter (HOSPITAL_COMMUNITY): Payer: Self-pay | Admitting: Emergency Medicine

## 2021-10-20 DIAGNOSIS — R202 Paresthesia of skin: Secondary | ICD-10-CM | POA: Insufficient documentation

## 2021-10-20 DIAGNOSIS — E119 Type 2 diabetes mellitus without complications: Secondary | ICD-10-CM | POA: Insufficient documentation

## 2021-10-20 DIAGNOSIS — Z79899 Other long term (current) drug therapy: Secondary | ICD-10-CM | POA: Insufficient documentation

## 2021-10-20 DIAGNOSIS — Z7984 Long term (current) use of oral hypoglycemic drugs: Secondary | ICD-10-CM | POA: Insufficient documentation

## 2021-10-20 DIAGNOSIS — E039 Hypothyroidism, unspecified: Secondary | ICD-10-CM | POA: Insufficient documentation

## 2021-10-20 DIAGNOSIS — Y900 Blood alcohol level of less than 20 mg/100 ml: Secondary | ICD-10-CM | POA: Insufficient documentation

## 2021-10-20 DIAGNOSIS — Z20822 Contact with and (suspected) exposure to covid-19: Secondary | ICD-10-CM | POA: Insufficient documentation

## 2021-10-20 DIAGNOSIS — I10 Essential (primary) hypertension: Secondary | ICD-10-CM | POA: Insufficient documentation

## 2021-10-20 HISTORY — DX: Unspecified osteoarthritis, unspecified site: M19.90

## 2021-10-20 HISTORY — DX: Disorder of thyroid, unspecified: E07.9

## 2021-10-20 HISTORY — DX: Type 2 diabetes mellitus without complications: E11.9

## 2021-10-20 HISTORY — DX: Essential (primary) hypertension: I10

## 2021-10-20 LAB — DIFFERENTIAL
Abs Immature Granulocytes: 0.01 10*3/uL (ref 0.00–0.07)
Basophils Absolute: 0 10*3/uL (ref 0.0–0.1)
Basophils Relative: 1 %
Eosinophils Absolute: 0.3 10*3/uL (ref 0.0–0.5)
Eosinophils Relative: 6 %
Immature Granulocytes: 0 %
Lymphocytes Relative: 45 %
Lymphs Abs: 2.5 10*3/uL (ref 0.7–4.0)
Monocytes Absolute: 0.4 10*3/uL (ref 0.1–1.0)
Monocytes Relative: 7 %
Neutro Abs: 2.2 10*3/uL (ref 1.7–7.7)
Neutrophils Relative %: 41 %

## 2021-10-20 LAB — RESP PANEL BY RT-PCR (FLU A&B, COVID) ARPGX2
Influenza A by PCR: NEGATIVE
Influenza B by PCR: NEGATIVE
SARS Coronavirus 2 by RT PCR: NEGATIVE

## 2021-10-20 LAB — RAPID URINE DRUG SCREEN, HOSP PERFORMED
Amphetamines: NOT DETECTED
Barbiturates: NOT DETECTED
Benzodiazepines: NOT DETECTED
Cocaine: NOT DETECTED
Opiates: NOT DETECTED
Tetrahydrocannabinol: NOT DETECTED

## 2021-10-20 LAB — I-STAT CHEM 8, ED
BUN: 13 mg/dL (ref 6–20)
Calcium, Ion: 1.04 mmol/L — ABNORMAL LOW (ref 1.15–1.40)
Chloride: 107 mmol/L (ref 98–111)
Creatinine, Ser: 0.7 mg/dL (ref 0.44–1.00)
Glucose, Bld: 146 mg/dL — ABNORMAL HIGH (ref 70–99)
HCT: 41 % (ref 36.0–46.0)
Hemoglobin: 13.9 g/dL (ref 12.0–15.0)
Potassium: 3.4 mmol/L — ABNORMAL LOW (ref 3.5–5.1)
Sodium: 140 mmol/L (ref 135–145)
TCO2: 25 mmol/L (ref 22–32)

## 2021-10-20 LAB — URINALYSIS, ROUTINE W REFLEX MICROSCOPIC
Bilirubin Urine: NEGATIVE
Glucose, UA: NEGATIVE mg/dL
Hgb urine dipstick: NEGATIVE
Ketones, ur: NEGATIVE mg/dL
Nitrite: NEGATIVE
Protein, ur: NEGATIVE mg/dL
Specific Gravity, Urine: 1.018 (ref 1.005–1.030)
pH: 6 (ref 5.0–8.0)

## 2021-10-20 LAB — COMPREHENSIVE METABOLIC PANEL
ALT: 14 U/L (ref 0–44)
AST: 16 U/L (ref 15–41)
Albumin: 3.9 g/dL (ref 3.5–5.0)
Alkaline Phosphatase: 73 U/L (ref 38–126)
Anion gap: 9 (ref 5–15)
BUN: 11 mg/dL (ref 6–20)
CO2: 26 mmol/L (ref 22–32)
Calcium: 9.7 mg/dL (ref 8.9–10.3)
Chloride: 106 mmol/L (ref 98–111)
Creatinine, Ser: 0.74 mg/dL (ref 0.44–1.00)
GFR, Estimated: >60 mL/min (ref >60)
Glucose, Bld: 147 mg/dL — ABNORMAL HIGH (ref 70–99)
Potassium: 3.3 mmol/L — ABNORMAL LOW (ref 3.5–5.1)
Sodium: 141 mmol/L (ref 135–145)
Total Bilirubin: 1.1 mg/dL (ref 0.3–1.2)
Total Protein: 6.9 g/dL (ref 6.5–8.1)

## 2021-10-20 LAB — CBC
HCT: 39.9 % (ref 36.0–46.0)
Hemoglobin: 13.3 g/dL (ref 12.0–15.0)
MCH: 29.4 pg (ref 26.0–34.0)
MCHC: 33.3 g/dL (ref 30.0–36.0)
MCV: 88.1 fL (ref 80.0–100.0)
Platelets: 275 10*3/uL (ref 150–400)
RBC: 4.53 MIL/uL (ref 3.87–5.11)
RDW: 14.5 % (ref 11.5–15.5)
WBC: 5.5 10*3/uL (ref 4.0–10.5)
nRBC: 0 % (ref 0.0–0.2)

## 2021-10-20 LAB — PROTIME-INR
INR: 1 (ref 0.8–1.2)
Prothrombin Time: 13.3 seconds (ref 11.4–15.2)

## 2021-10-20 LAB — TSH: TSH: 0.79 u[IU]/mL (ref 0.350–4.500)

## 2021-10-20 LAB — ETHANOL: Alcohol, Ethyl (B): <10 mg/dL (ref <10)

## 2021-10-20 LAB — APTT: aPTT: 29 seconds (ref 24–36)

## 2021-10-20 IMAGING — MR MR CERVICAL SPINE W/O CM
4 of 5 series · 19 of 48 positions shown · non-contrast
Comparison: None.

CLINICAL DATA: Cervical radiculopathy, no red flags; left hand
numbness

EXAM:
MRI CERVICAL SPINE WITHOUT CONTRAST
TECHNIQUE: Multiplanar, multisequence MR imaging of the cervical spine was
performed. No intravenous contrast was administered.

[Series 12: T2 · sagittal · 3.0mm · 0.43mm/px · 6 of 17 slices shown (1 of 2)]
[im 1/17]
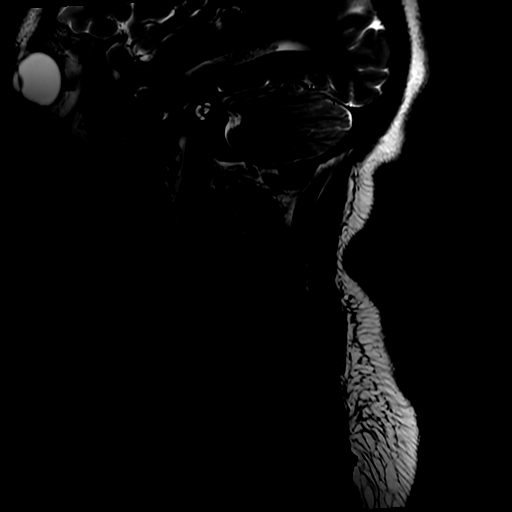
[im 4/17]
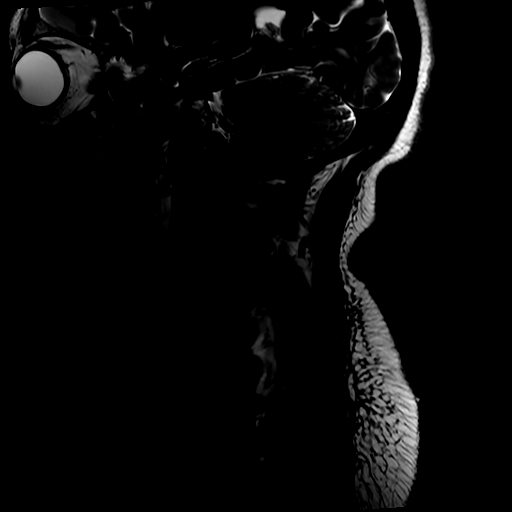
[im 7/17]
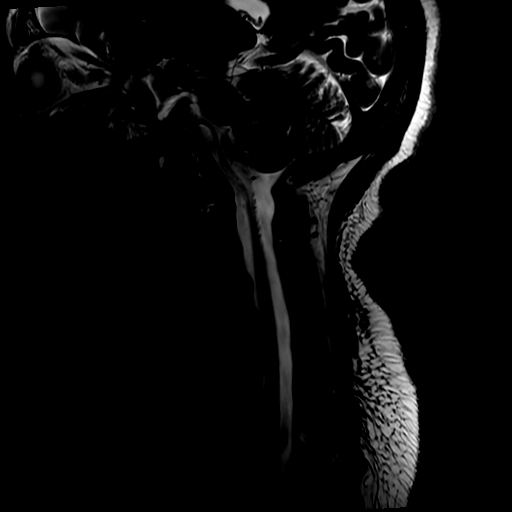
[im 10/17]
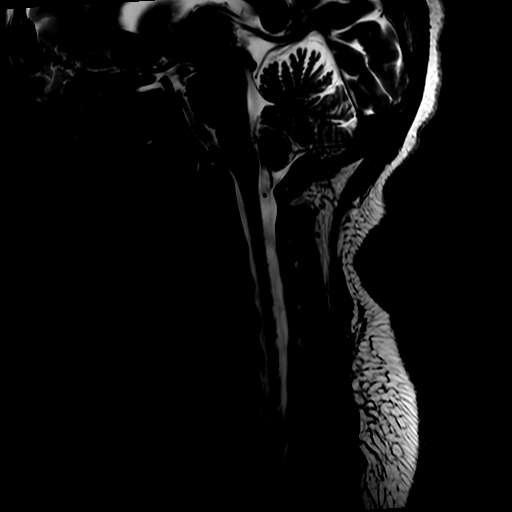
[im 13/17]
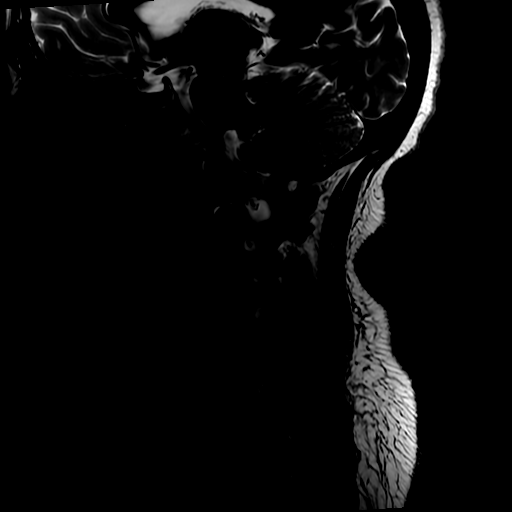
[im 17/17]
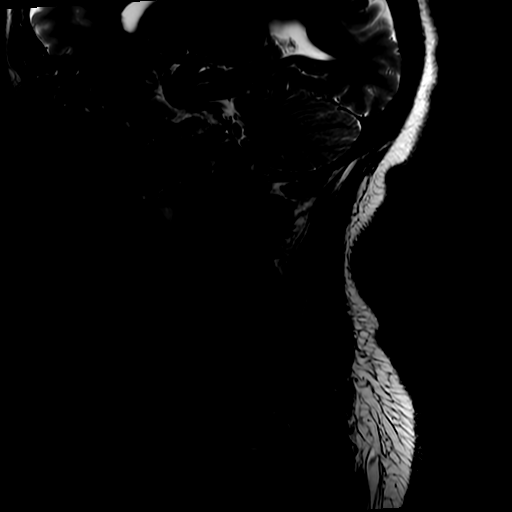

[Series 13: STIR · sagittal · 3.0mm · 0.43mm/px · 3 of 17 slices shown]
[im 1/17]
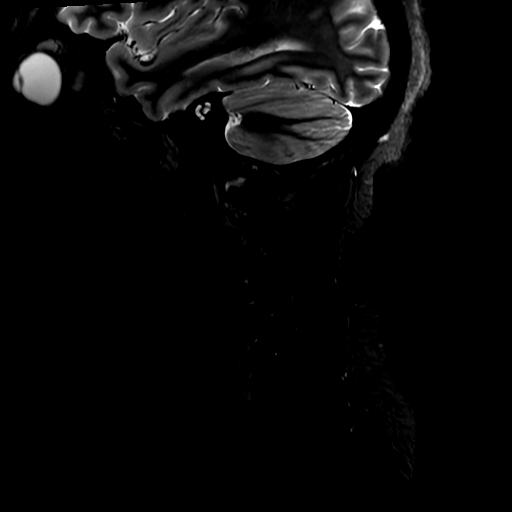
[im 9/17]
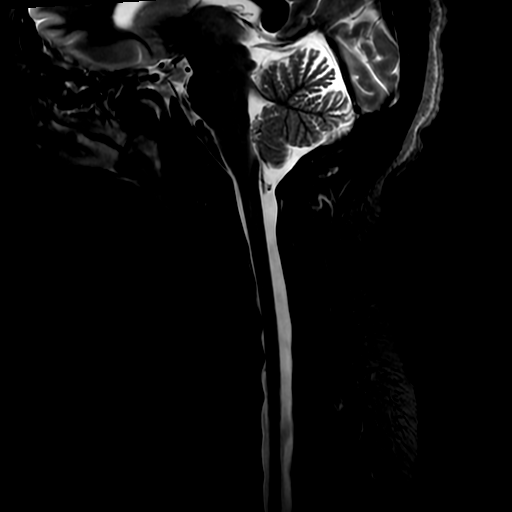
[im 17/17]
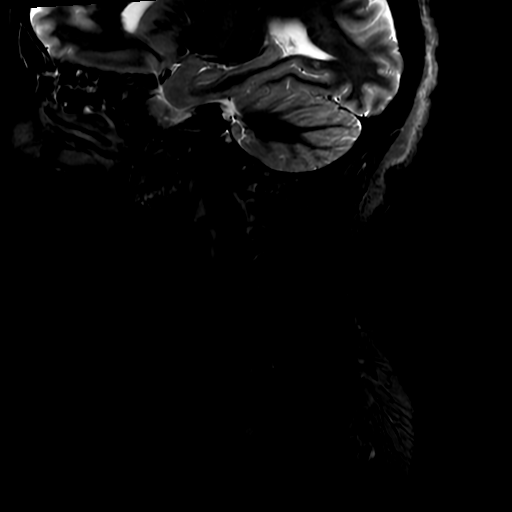

[Series 14: FLAIR · sagittal · 3.0mm · 0.43mm/px · 3 of 17 slices shown]
[im 1/17]
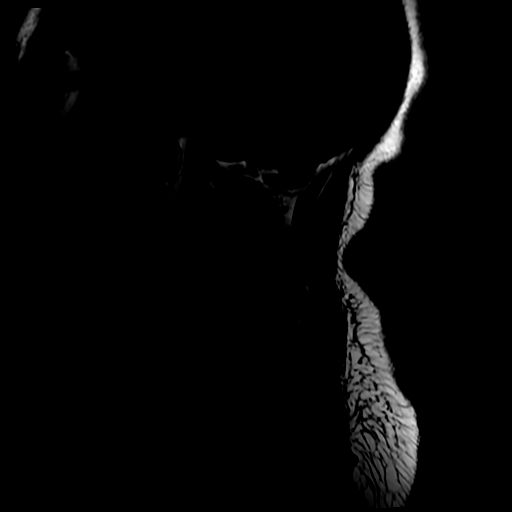
[im 9/17]
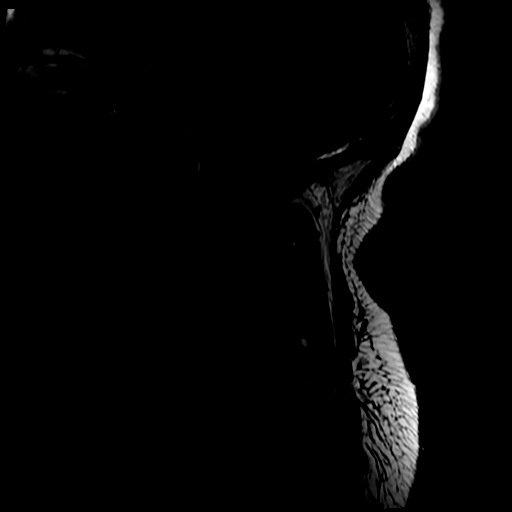
[im 17/17]
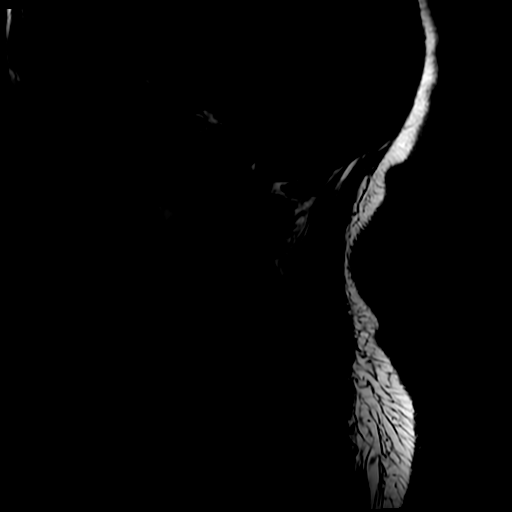

[Series 16: T2 · axial · 3.0mm · 0.35mm/px · z∈[-216,-147]mm · 7 of 27 slices shown (2 of 2)]
[im 1/27]
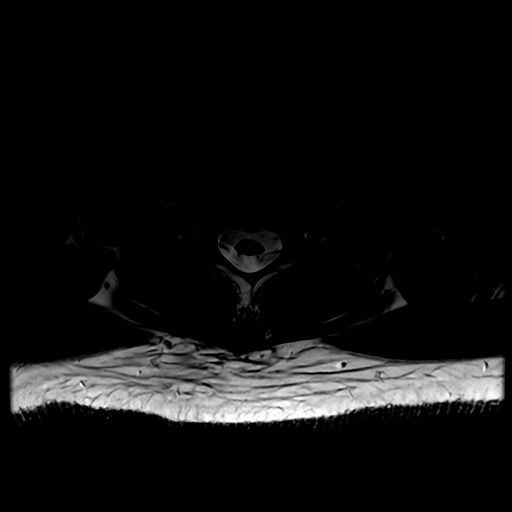
[im 4/27]
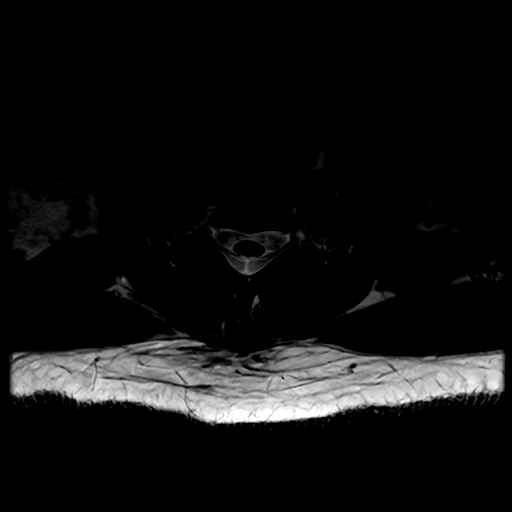
[im 8/27]
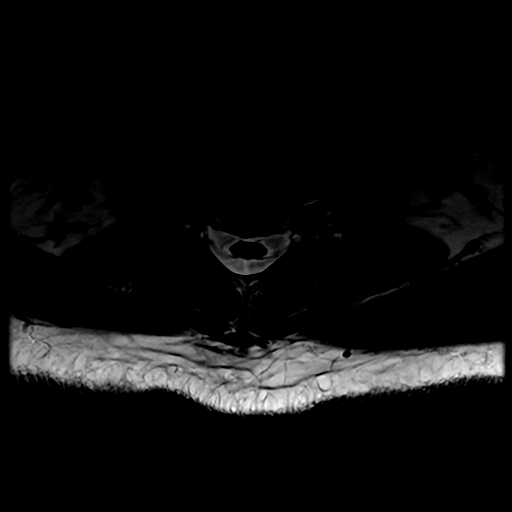
[im 12/27]
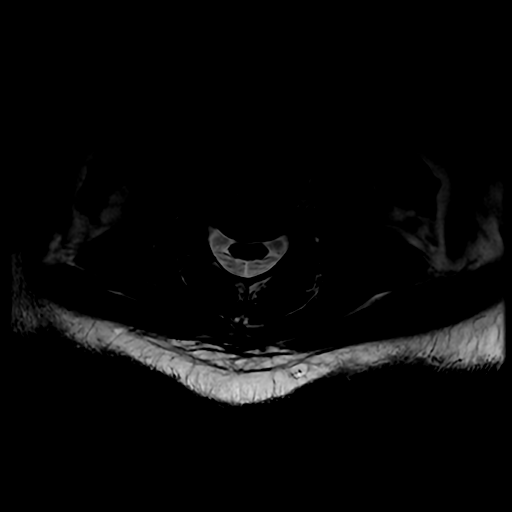
[im 15/27]
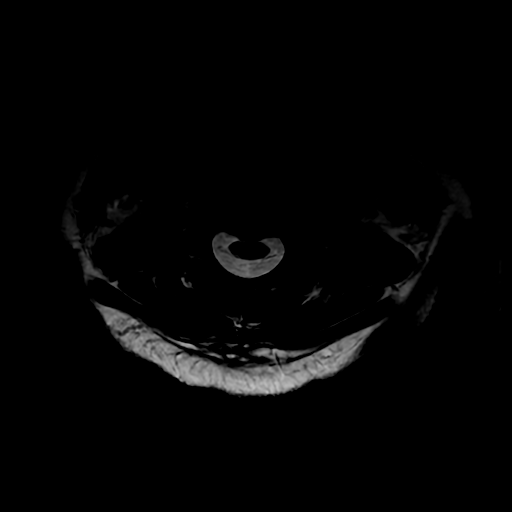
[im 19/27]
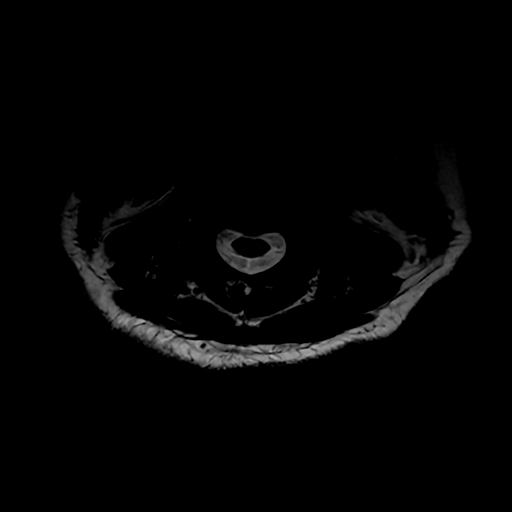
[im 23/27]
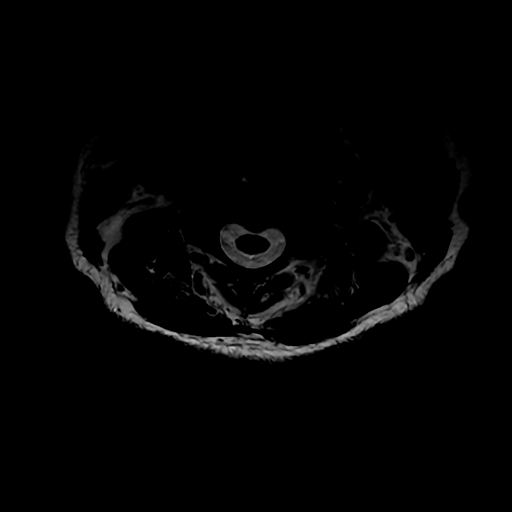

[19 of 48 positions shown; findings below may reference images not displayed]

FINDINGS: Alignment: No significant listhesis.

Vertebrae: Vertebral body heights are maintained. No marrow edema.
No suspicious osseous lesion.

Cord: No abnormal signal.

Posterior Fossa, vertebral arteries, paraspinal tissues:
Unremarkable. Intracranial findings are evaluated separately.

Disc levels:

C2-C3:  No canal or foraminal stenosis.

C3-C4:  No canal or foraminal stenosis.

C4-C5:  Minimal disc bulge.  No canal or foraminal stenosis.

C5-C6: Minimal disc bulge with superimposed right central protrusion
that mildly indents the right ventral thecal sac. No canal or
foraminal stenosis.

C6-C7:  No canal or foraminal stenosis.

C7-T1:  No canal or foraminal stenosis.
IMPRESSION: No abnormal cord signal.  Minor degenerative changes.

## 2021-10-20 IMAGING — MR MR HEAD W/O CM
7 of 11 series · 25 of 48 positions shown · non-contrast
Comparison: CT head from the same day.

CLINICAL DATA: Neuro deficit, acute, stroke suspected

EXAM:
MRI HEAD WITHOUT CONTRAST
TECHNIQUE: Multiplanar, multiecho pulse sequences of the brain and surrounding
structures were obtained without intravenous contrast.

[Series 3: DWI · axial · 3.0mm · 0.94mm/px · z∈[-124,+15]mm · 7 of 100 slices shown (1 of 2)]
[im 1/100]
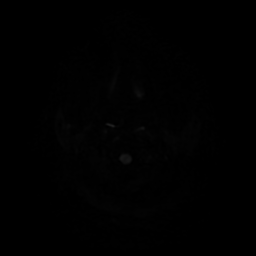
[im 17/100]
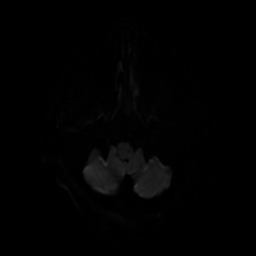
[im 34/100]
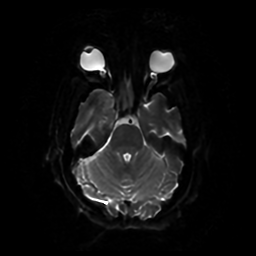
[im 50/100]
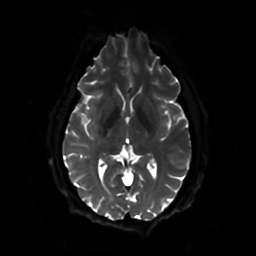
[im 67/100]
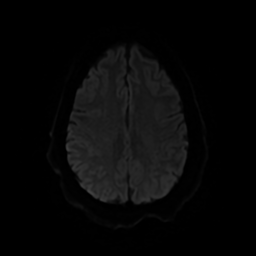
[im 83/100]
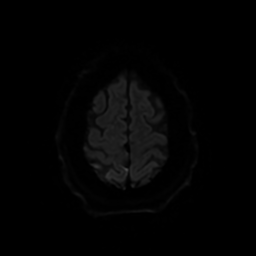
[im 100/100]
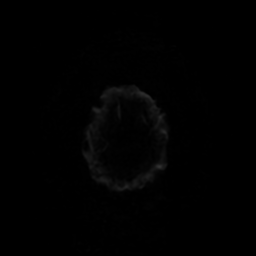

[Series 4: DWI · coronal · 4.0mm · 0.94mm/px · 5 of 74 slices shown (2 of 2)]
[im 1/74]
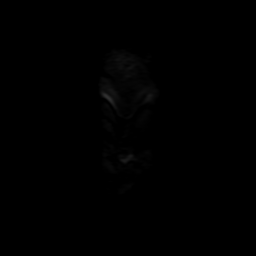
[im 19/74]
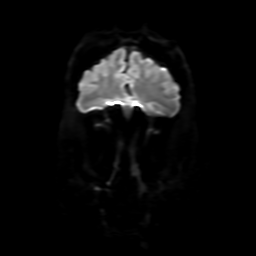
[im 37/74]
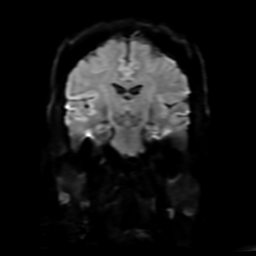
[im 55/74]
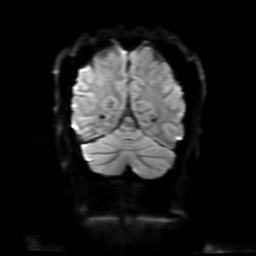
[im 74/74]
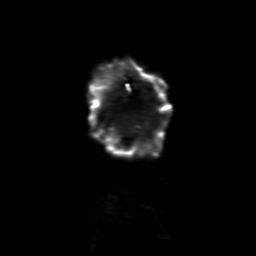

[Series 5: FLAIR · sagittal · 5.0mm · 0.23mm/px · 2 of 23 slices shown (1 of 2)]
[im 1/23]
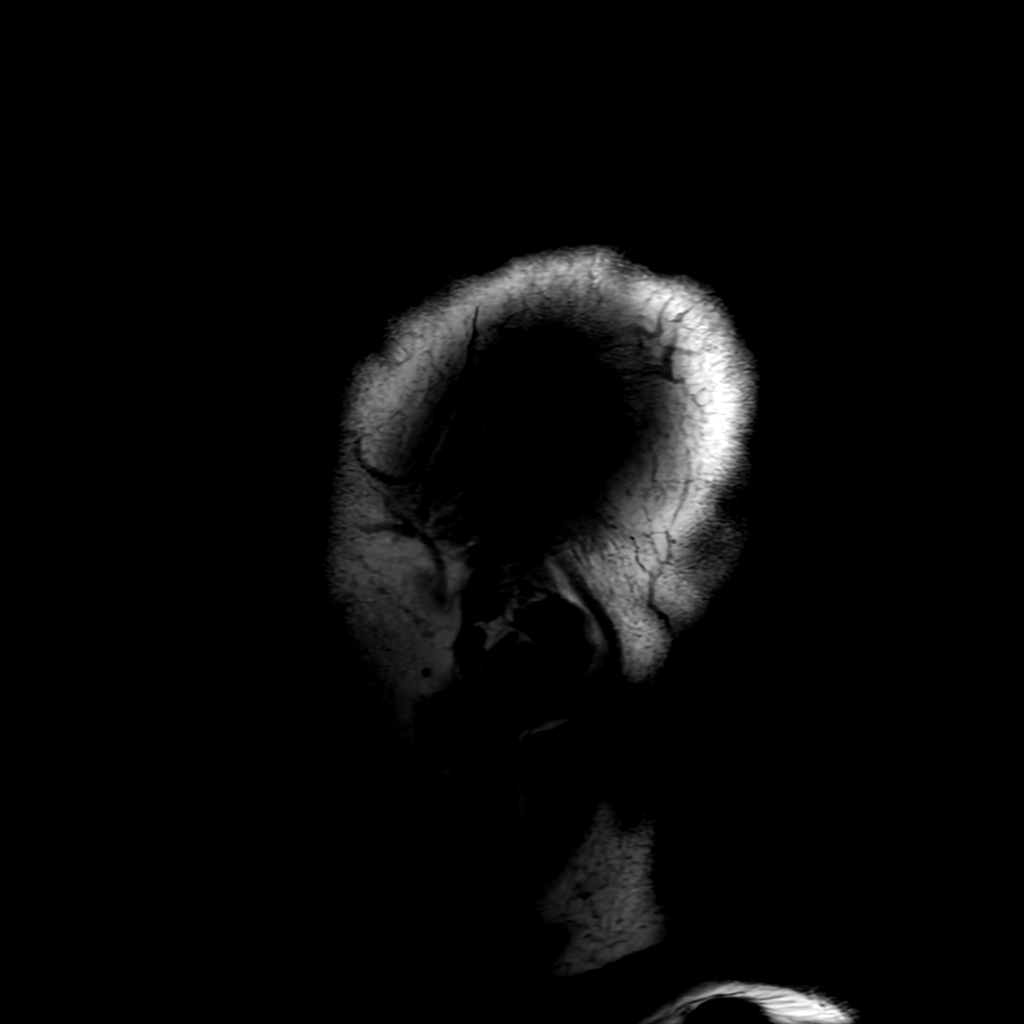
[im 23/23]
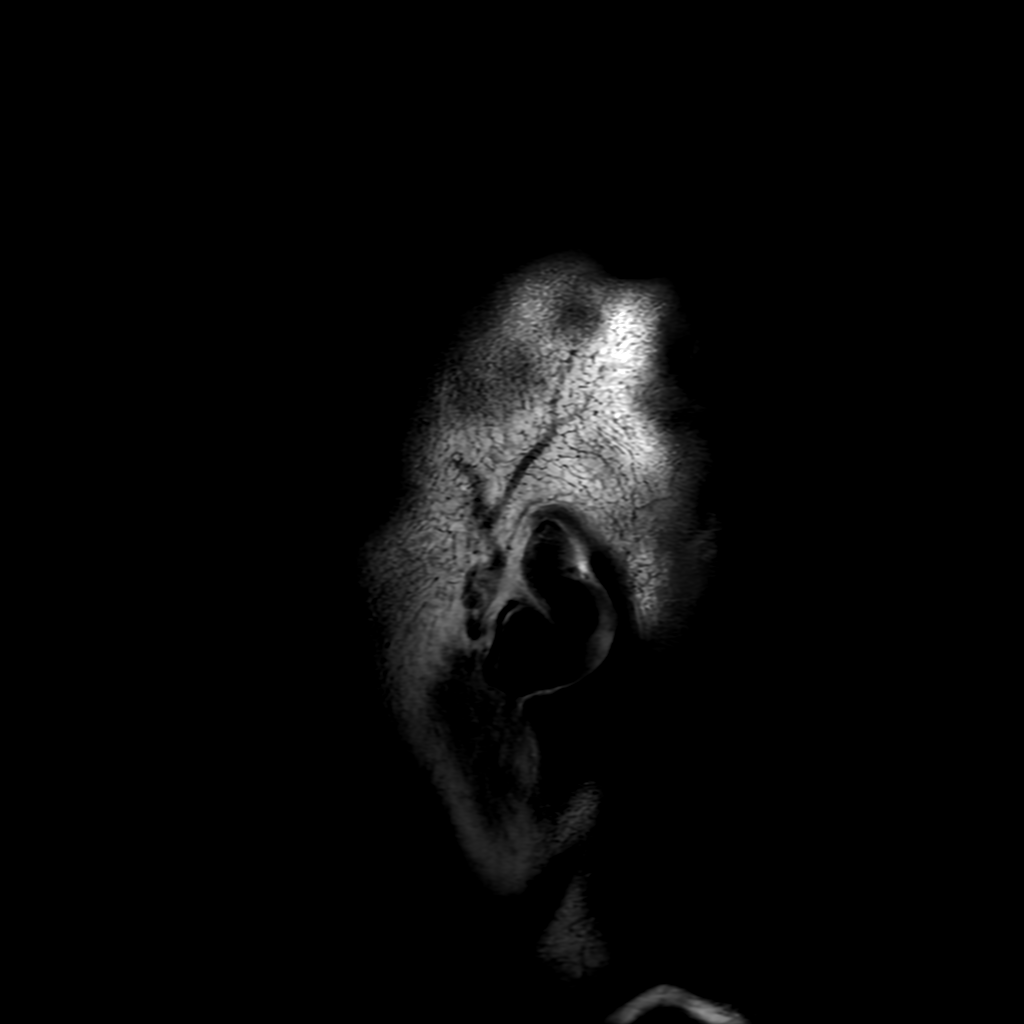

[Series 6: T2 · axial · 5.0mm · 0.23mm/px · 1 of 26 slices shown]
[im 1/26]
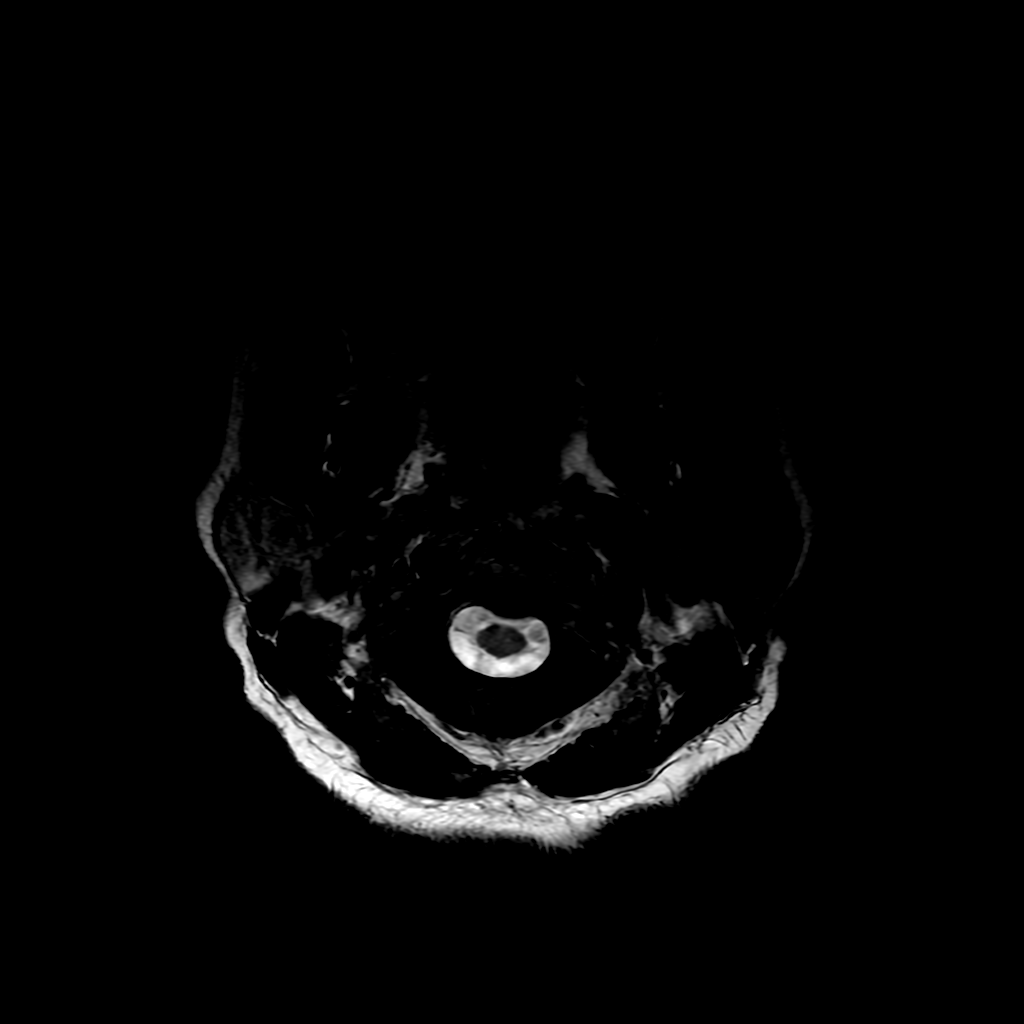

[Series 7: FLAIR · axial · 4.0mm · 0.47mm/px · z∈[-131,+11]mm · 3 of 35 slices shown (2 of 2)]
[im 1/35]
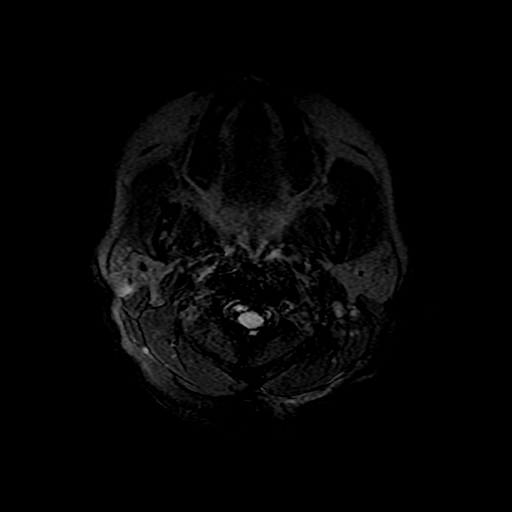
[im 18/35]
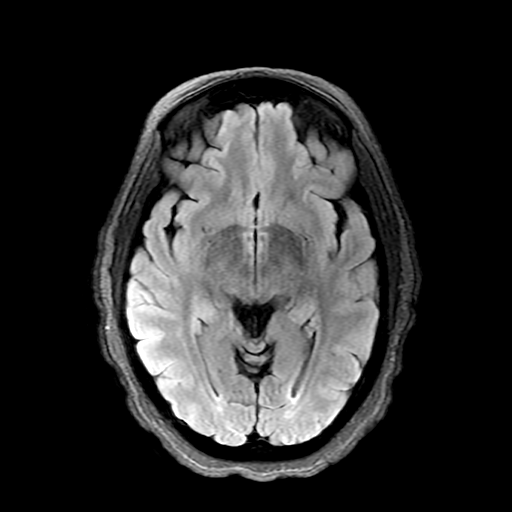
[im 35/35]
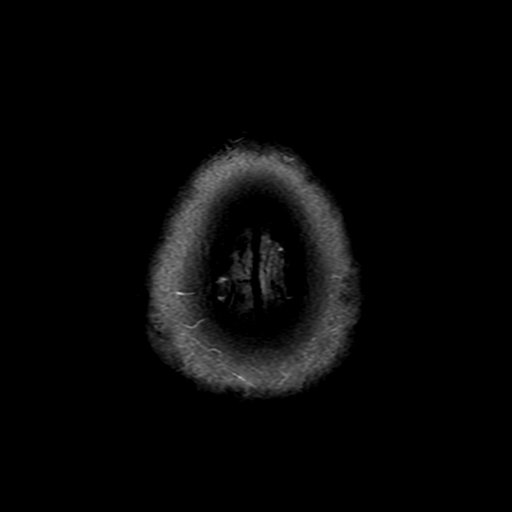

[Series 350: ADC · axial · 3.0mm · 0.94mm/px · z∈[-124,+15]mm · 4 of 50 slices shown (1 of 2)]
[im 1/50]
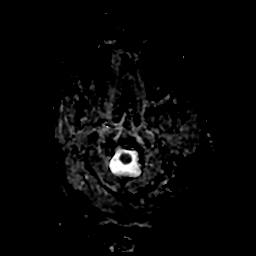
[im 17/50]
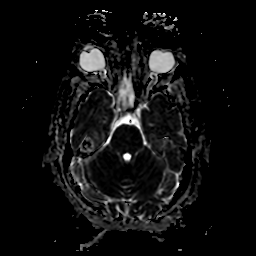
[im 33/50]
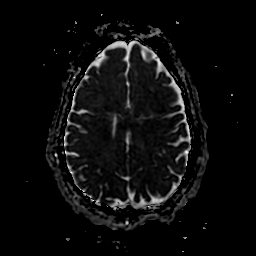
[im 50/50]
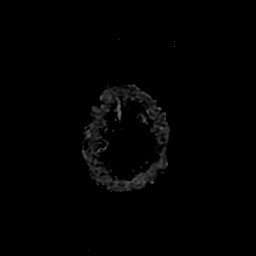

[Series 450: ADC · coronal · 4.0mm · 0.94mm/px · 3 of 36 slices shown (2 of 2)]
[im 1/36]
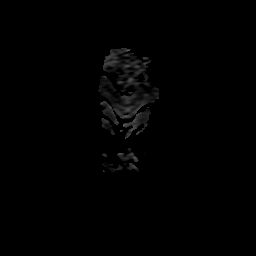
[im 18/36]
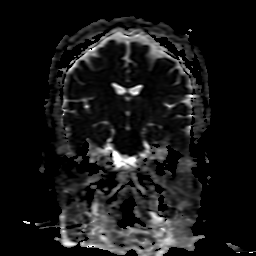
[im 36/36]
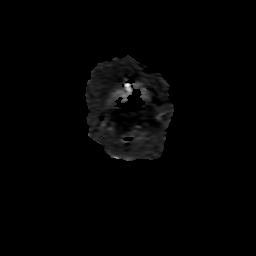

[25 of 48 positions shown; findings below may reference images not displayed]

FINDINGS: Brain: No acute infarction, hemorrhage, hydrocephalus, extra-axial
collection or mass lesion. A few scattered punctate T2/FLAIR
hyperintensities in the white matter, nonspecific but considered
within normal limits for patient age.

Vascular: Major arterial flow voids are maintained at the skull
base.

Skull and upper cervical spine: Normal marrow signal.

Sinuses/Orbits: Clear sinuses.  No acute orbital findings.

Other: No mastoid effusions.
IMPRESSION: No evidence of acute intracranial abnormality.

## 2021-10-20 NOTE — ED Notes (Signed)
Patient transported to MRI 

## 2021-10-20 NOTE — ED Provider Notes (Signed)
Patient seen after prior EDP.  ? ?Workup is without acute findings.  ? ?Patient understands need for close FU in the outpatient setting. ? ? ?  ?Wynetta Fines, MD ?10/20/21 1843 ? ?

## 2021-10-20 NOTE — Discharge Instructions (Signed)
Return for any problem.  ?

## 2021-10-20 NOTE — ED Triage Notes (Signed)
Patient reports waking up this morning around 6am with left hand numbness and tingling. States she took her BP and it was elevated so took an extra dose of medication. While speaking on the phone this morning she began having trouble speaking. Also having left eye pain for past few days.  ?

## 2021-10-20 NOTE — ED Provider Triage Note (Signed)
Emergency Medicine Provider Triage Evaluation Note ? ?[name] , a 61 y.o. female  was evaluated in triage.  Pt complains of left hand numbeness. ?BP 190/111 on Saturday, improved Sunday and then went back up. Woke up this morning and left hand is numb. Called out of work and when someone called her back she began stuttering and has been stuttering ever since. Woke up at 6am. [name] to bed around 8-9 pm but didn't fall asleep until midnight (finally took 2 melatonin gummies). Woke up at 2am bc she couldn't sleep (this is normal for her) so she folded laundry in bed- hand was not numb at that time. Thinks she was still awake from 3-4 am and hand was not numb then, may have fallen asleep around 5am, not certain.  ?Review of Systems  ?Positive: Left side altered sensation  (diffuse- face to foot), change in speech ?Negative: Fever, illness ? ?Physical Exam  ?BP (!) 164/96 (BP Location: Left Arm)   Pulse 80   Temp 99.1 ?F (37.3 ?C) (Oral)   Resp 16   Ht 5\' 4"  (1.626 m)   Wt 78.9 kg   SpO2 99%   BMI 29.87 kg/m?  ?Gen:   Awake, no distress   ?Resp:  Normal effort  ?MSK:   Moves extremities without difficulty  ?Other:  Question left facial weakness although smile appears symmetric. Reports diminished sensation diffuse left side body, occasional stuttering speech ? ?Medical Decision Making  ?Medically screening exam initiated at 10:46 AM.  Appropriate orders placed.  [name] was informed that the remainder of the evaluation will be completed by another provider, this initial triage assessment does not replace that evaluation, and the importance of remaining in the ED until their evaluation is complete. ? ? ?  ?Natasha Hales, PA-C ?10/20/21 1053 ? ?

## 2021-10-20 NOTE — ED Notes (Signed)
Pt alert, NAD, calm, interactive, speaking with visitor at [facility], MRI updated on pt, pending MRI. ?

## 2021-10-20 NOTE — ED Notes (Signed)
EDP at [facility], pt updated with plan. Alert, NAD, calm, interactive. Deficits noted: stuttering, and decreased sensation on L, sharper on R. Passed swallow screen. ?

## 2021-10-20 NOTE — ED Provider Notes (Signed)
?[facility] EMERGENCY DEPARTMENT ?Provider Note ? ? ?CSN: [id number] ?Arrival date & time: 10/20/21  1029 ? ?  ? ?History ? ?Chief Complaint  ?Patient presents with  ? Numbness  ? ? ?[name] is a 61 y.o. female. ? ?Pt is a 61 yo femal with pmhx significant for htn, dm, and hypothyroidism.  Pt said she has had blood pressure which has been more elevated than nl.  She said she woke up this am around 0600 today.  When she woke up, she had left arm numbness.  She also has left leg numbness, but it is not as bad as her arm.  She is also having trouble speaking.  She said she is stuttering which is not normal for her.  LSN is difficult to ascertain.  She does not sleep well and woke up several times in the night.  She thinks she was nl at 0200.  Pt denies f/c.  She denies visual changes.  She is moving everything ok. ? ? ?  ? ?Home Medications ?Prior to Admission medications   ?Medication Sig Start Date End Date Taking? Authorizing Provider  ?enalapril (VASOTEC) 20 MG tablet Take 20 mg by mouth daily.   Yes [provider]  ?gabapentin (NEURONTIN) 300 MG capsule Take 300 mg by mouth 2 (two) times daily as needed (pain).   Yes [provider]  ?ibuprofen (ADVIL) 200 MG tablet Take 1,000 mg by mouth daily as needed for mild pain.   Yes [provider]  ?levothyroxine (SYNTHROID) 100 MCG tablet Take 100 mcg by mouth daily before breakfast. Unithroid brand   Yes [provider]  ?simvastatin (ZOCOR) 10 MG tablet Take 10 mg by mouth daily.   Yes [provider]  ?sitaGLIPtin-metformin (JANUMET) 50-500 MG tablet Take 1 tablet by mouth daily.   Yes [provider]  ?   ? ?Allergies    ?Influenza vaccines   ? ?Review of Systems   ?Review of Systems  ?Neurological:  Positive for numbness.  ?All other systems reviewed and are negative. ? ?Physical Exam ?Updated Vital Signs ?BP (!) 140/94   Pulse (!) 57   Temp 99.1 ?F (37.3 ?C) (Oral)   Resp 15   Ht 5'  4" (1.626 m)   Wt 78.9 kg   SpO2 100%   BMI 29.87 kg/m?  ?Physical Exam ?Vitals and nursing note reviewed.  ?Constitutional:   ?   Appearance: Normal appearance.  ?HENT:  ?   Head: Normocephalic and atraumatic.  ?   Right Ear: External ear normal.  ?   Left Ear: External ear normal.  ?   Nose: Nose normal.  ?   Mouth/Throat:  ?   Mouth: Mucous membranes are moist.  ?   Pharynx: Oropharynx is clear.  ?Eyes:  ?   Extraocular Movements: Extraocular movements intact.  ?   Conjunctiva/sclera: Conjunctivae normal.  ?   Pupils: Pupils are equal, round, and reactive to light.  ?Cardiovascular:  ?   Rate and Rhythm: Normal rate and regular rhythm.  ?   Pulses: Normal pulses.  ?   Heart sounds: Normal heart sounds.  ?Pulmonary:  ?   Effort: Pulmonary effort is normal.  ?   Breath sounds: Normal breath sounds.  ?Abdominal:  ?   General: Abdomen is flat. Bowel sounds are normal.  ?   Palpations: Abdomen is soft.  ?Musculoskeletal:     ?   General: Normal range of motion.  ?   Cervical back:  Normal range of motion and neck supple.  ?Skin: ?   General: Skin is warm.  ?   Capillary Refill: Capillary refill takes less than 2 seconds.  ?Neurological:  ?   Mental Status: She is alert and oriented to person, place, and time.  ?   Comments: Stuttering on exam.  Subjective numbness left arm.  ?Psychiatric:     ?   Mood and Affect: Mood normal.  ? ? ?ED Results / Procedures / Treatments   ?Labs ?(all labs ordered are listed, but only abnormal results are displayed) ?Labs Reviewed  ?COMPREHENSIVE METABOLIC PANEL - Abnormal; Notable for the following components:  ?    Result Value  ? Potassium 3.3 (*)   ? Glucose, Bld 147 (*)   ? All other components within normal limits  ?URINALYSIS, ROUTINE W REFLEX MICROSCOPIC - Abnormal; Notable for the following components:  ? Leukocytes,Ua MODERATE (*)   ? Bacteria, UA RARE (*)   ? Non Squamous Epithelial 0-5 (*)   ? All other components within normal limits  ?I-STAT CHEM 8, ED - Abnormal; Notable  for the following components:  ? Potassium 3.4 (*)   ? Glucose, Bld 146 (*)   ? Calcium, Ion 1.04 (*)   ? All other components within normal limits  ?RESP PANEL BY RT-PCR (FLU A&B, COVID) ARPGX2  ?ETHANOL  ?PROTIME-INR  ?APTT  ?CBC  ?DIFFERENTIAL  ?RAPID URINE DRUG SCREEN, HOSP PERFORMED  ?TSH  ? ? ?EKG ?EKG Interpretation ? ?Date/Time:  Tuesday October 20 2021 10:39:45 EDT ?Ventricular Rate:  71 ?PR Interval:  176 ?QRS Duration: 82 ?QT Interval:  402 ?QTC Calculation: 436 ?R Axis:   -57 ?Text Interpretation: Normal sinus rhythm Left axis deviation Low voltage QRS Possible Anterolateral infarct , age undetermined Abnormal ECG When compared with ECG of 20-Aug-2010 19:51, PREVIOUS ECG IS PRESENT No significant change since last tracing Confirmed by Jacalyn LefevreHaviland, Shirlee Whitmire [phone]) on 10/20/2021 11:39:23 AM ? ?Radiology ?CT HEAD WO CONTRAST ? ?Result Date: 10/20/2021 ?CLINICAL DATA:  Neuro deficit.  Acute stroke suspected EXAM: CT HEAD WITHOUT CONTRAST TECHNIQUE: Contiguous axial images were obtained from the base of the skull through the vertex without intravenous contrast. RADIATION DOSE REDUCTION: This exam was performed according to the departmental dose-optimization program which includes automated exposure control, adjustment of the mA and/or kV according to patient size and/or use of iterative reconstruction technique. COMPARISON:  08/10/2010 FINDINGS: Brain: No mass lesion, hemorrhage, hydrocephalus, acute infarct, intra-axial, or extra-axial fluid collection. Suspect mild cerebral atrophy for age. Vascular: No hyperdense vessel or unexpected calcification. Skull: Normal Sinuses/Orbits: Normal imaged portions of the orbits and globes. Clear paranasal sinuses and mastoid air cells. Other: None. IMPRESSION: No acute intracranial abnormality. Suspect mildly age advanced cerebral atrophy. Electronically Signed   By: Jeronimo GreavesKyle  Talbot M.D.   On: 10/20/2021 11:40   ? ?Procedures ?Procedures  ? ? ?Medications Ordered in ED ?Medications  - No data to display ? ?ED Course/ Medical Decision Making/ A&P ?  ?                        ?Medical Decision Making ?Amount and/or Complexity of Data Reviewed ?Labs: ordered. ?Radiology: ordered. ? ? ?This patient presents to the ED for concern of left arm numbness, this involves an extensive number of treatment options, and is a complaint that carries with it a high risk of complications and morbidity.  The differential diagnosis includes cva, radiculopathy ? ? ?Co morbidities that complicate the patient  evaluation ? ?htn, dm, and hypothyroidism. ? ? ?Additional history obtained: ? ?Additional history obtained from epic chart review ? ? ? ?Lab Tests: ? ?I Ordered, and personally interpreted labs.  The pertinent results include:  cbc nl, cmp nl, etoh neg, inr nl, UDS neg, ua neg ? ? ?Imaging Studies ordered: ? ?I ordered imaging studies including ct head  ?I independently visualized and interpreted imaging which showed  ?  ?IMPRESSION:  ?No acute intracranial abnormality. Suspect mildly age advanced  ?cerebral atrophy.  ? ?I agree with the radiologist interpretation ? ? ?Cardiac Monitoring: ? ?The patient was maintained on a cardiac monitor.  I personally viewed and interpreted the cardiac monitored which showed an underlying rhythm of: nsr ? ? ?Test Considered: ? ?mri ? ? ?Critical Interventions: ? ?mri ? ? ? ?Problem List / ED Course: ? ?Numbness to left arm:  MRI pending at shift change.  Pt signed out at shift change to Dr. Rodena Medin ? ? ? ?Social Determinants of Health: ? ?Lives at home ? ? ? ? ? ? ? ? ? ?Final Clinical Impression(s) / ED Diagnoses ?Final diagnoses:  ?Paresthesia  ? ? ?Rx / DC Orders ?ED Discharge Orders   ? ? None  ? ?  ? ? ?  ?Jacalyn Lefevre, MD ?10/20/21 1619 ? ?

## 2021-10-20 NOTE — ED Notes (Signed)
Ambulatory to b/r, steady gait. Urine sample sent to lab. ?

## 2021-10-20 NOTE — ED Notes (Signed)
Pt remains in MRI 

## 2021-10-20 NOTE — ED Notes (Signed)
Patient verbalizes understanding of discharge instructions. Opportunity for questioning and answers were provided. Armband removed by staff, pt discharged from ED.  

## 2021-10-20 NOTE — ED Notes (Signed)
Pt not in room.
